# Patient Record
Sex: Male | Born: 1940 | Race: White | Hispanic: No | Marital: Married | Smoking: Never smoker
Health system: Southern US, Community
[De-identification: ages and names within clinical notes are randomized; demographics above are authoritative.]

## PROBLEM LIST (undated history)

## (undated) DIAGNOSIS — Z8669 Personal history of other diseases of the nervous system and sense organs: Secondary | ICD-10-CM

## (undated) DIAGNOSIS — I4510 Unspecified right bundle-branch block: Secondary | ICD-10-CM

## (undated) DIAGNOSIS — F1011 Alcohol abuse, in remission: Secondary | ICD-10-CM

## (undated) DIAGNOSIS — G4730 Sleep apnea, unspecified: Secondary | ICD-10-CM

## (undated) DIAGNOSIS — I10 Essential (primary) hypertension: Secondary | ICD-10-CM

## (undated) DIAGNOSIS — R202 Paresthesia of skin: Secondary | ICD-10-CM

## (undated) DIAGNOSIS — K219 Gastro-esophageal reflux disease without esophagitis: Secondary | ICD-10-CM

## (undated) DIAGNOSIS — M1990 Unspecified osteoarthritis, unspecified site: Secondary | ICD-10-CM

## (undated) DIAGNOSIS — E785 Hyperlipidemia, unspecified: Secondary | ICD-10-CM

## (undated) DIAGNOSIS — M4802 Spinal stenosis, cervical region: Secondary | ICD-10-CM

## (undated) DIAGNOSIS — R55 Syncope and collapse: Secondary | ICD-10-CM

## (undated) DIAGNOSIS — I480 Paroxysmal atrial fibrillation: Secondary | ICD-10-CM

## (undated) DIAGNOSIS — I2510 Atherosclerotic heart disease of native coronary artery without angina pectoris: Secondary | ICD-10-CM

## (undated) DIAGNOSIS — Z87898 Personal history of other specified conditions: Secondary | ICD-10-CM

## (undated) DIAGNOSIS — R188 Other ascites: Secondary | ICD-10-CM

## (undated) DIAGNOSIS — G459 Transient cerebral ischemic attack, unspecified: Secondary | ICD-10-CM

## (undated) DIAGNOSIS — M48062 Spinal stenosis, lumbar region with neurogenic claudication: Secondary | ICD-10-CM

## (undated) DIAGNOSIS — D329 Benign neoplasm of meninges, unspecified: Secondary | ICD-10-CM

## (undated) HISTORY — PX: HERNIA REPAIR: SHX51

## (undated) HISTORY — PX: EYE SURGERY: SHX253

## (undated) HISTORY — PX: TRIGGER FINGER RELEASE: SHX641

## (undated) HISTORY — PX: COLONOSCOPY: SHX174

## (undated) HISTORY — DX: Other ascites: R18.8

## (undated) HISTORY — PX: APPENDECTOMY: SHX54

## (undated) HISTORY — PX: UPPER GASTROINTESTINAL ENDOSCOPY: SHX188

---
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
## 1979-02-03 HISTORY — PX: OTHER SURGICAL HISTORY: SHX169

## 1997-02-02 DIAGNOSIS — R55 Syncope and collapse: Secondary | ICD-10-CM

## 1997-02-02 HISTORY — DX: Syncope and collapse: R55

## 2002-03-05 HISTORY — PX: CARDIAC CATHETERIZATION: SHX172

## 2002-09-06 HISTORY — PX: CERVICAL LAMINECTOMY: SHX94

## 2005-08-12 HISTORY — PX: ATRIAL ABLATION SURGERY: SHX560

## 2007-07-13 ENCOUNTER — Encounter: Admission: RE | Admit: 2007-07-13 | Discharge: 2007-07-13 | Payer: Self-pay | Admitting: Neurosurgery

## 2008-01-16 ENCOUNTER — Encounter: Admission: RE | Admit: 2008-01-16 | Discharge: 2008-01-16 | Payer: Self-pay | Admitting: Neurosurgery

## 2008-07-23 ENCOUNTER — Encounter: Admission: RE | Admit: 2008-07-23 | Discharge: 2008-07-23 | Payer: Self-pay | Admitting: Neurosurgery

## 2008-08-16 ENCOUNTER — Inpatient Hospital Stay (HOSPITAL_COMMUNITY): Admission: RE | Admit: 2008-08-16 | Discharge: 2008-08-18 | Payer: Self-pay | Admitting: Orthopedic Surgery

## 2008-09-24 ENCOUNTER — Ambulatory Visit (HOSPITAL_COMMUNITY): Admission: RE | Admit: 2008-09-24 | Discharge: 2008-09-24 | Payer: Self-pay | Admitting: Neurosurgery

## 2008-10-03 HISTORY — PX: BRAIN MENINGIOMA EXCISION: SHX576

## 2008-10-11 ENCOUNTER — Inpatient Hospital Stay (HOSPITAL_COMMUNITY): Admission: RE | Admit: 2008-10-11 | Discharge: 2008-10-13 | Payer: Self-pay | Admitting: Neurosurgery

## 2008-10-11 ENCOUNTER — Encounter (INDEPENDENT_AMBULATORY_CARE_PROVIDER_SITE_OTHER): Payer: Self-pay | Admitting: Neurosurgery

## 2008-10-14 ENCOUNTER — Emergency Department (HOSPITAL_COMMUNITY): Admission: EM | Admit: 2008-10-14 | Discharge: 2008-10-14 | Payer: Self-pay | Admitting: Emergency Medicine

## 2009-01-09 ENCOUNTER — Encounter: Admission: RE | Admit: 2009-01-09 | Discharge: 2009-01-09 | Payer: Self-pay | Admitting: Neurosurgery

## 2010-03-20 IMAGING — CR DG CHEST 1V PORT
1 series · 1 of 1 positions shown · non-contrast
Comparison: 08/13/2008.

CLINICAL DATA: Chest pain shortness of breath.  Hypertension.

PORTABLE CHEST - 1 VIEW

[AP]
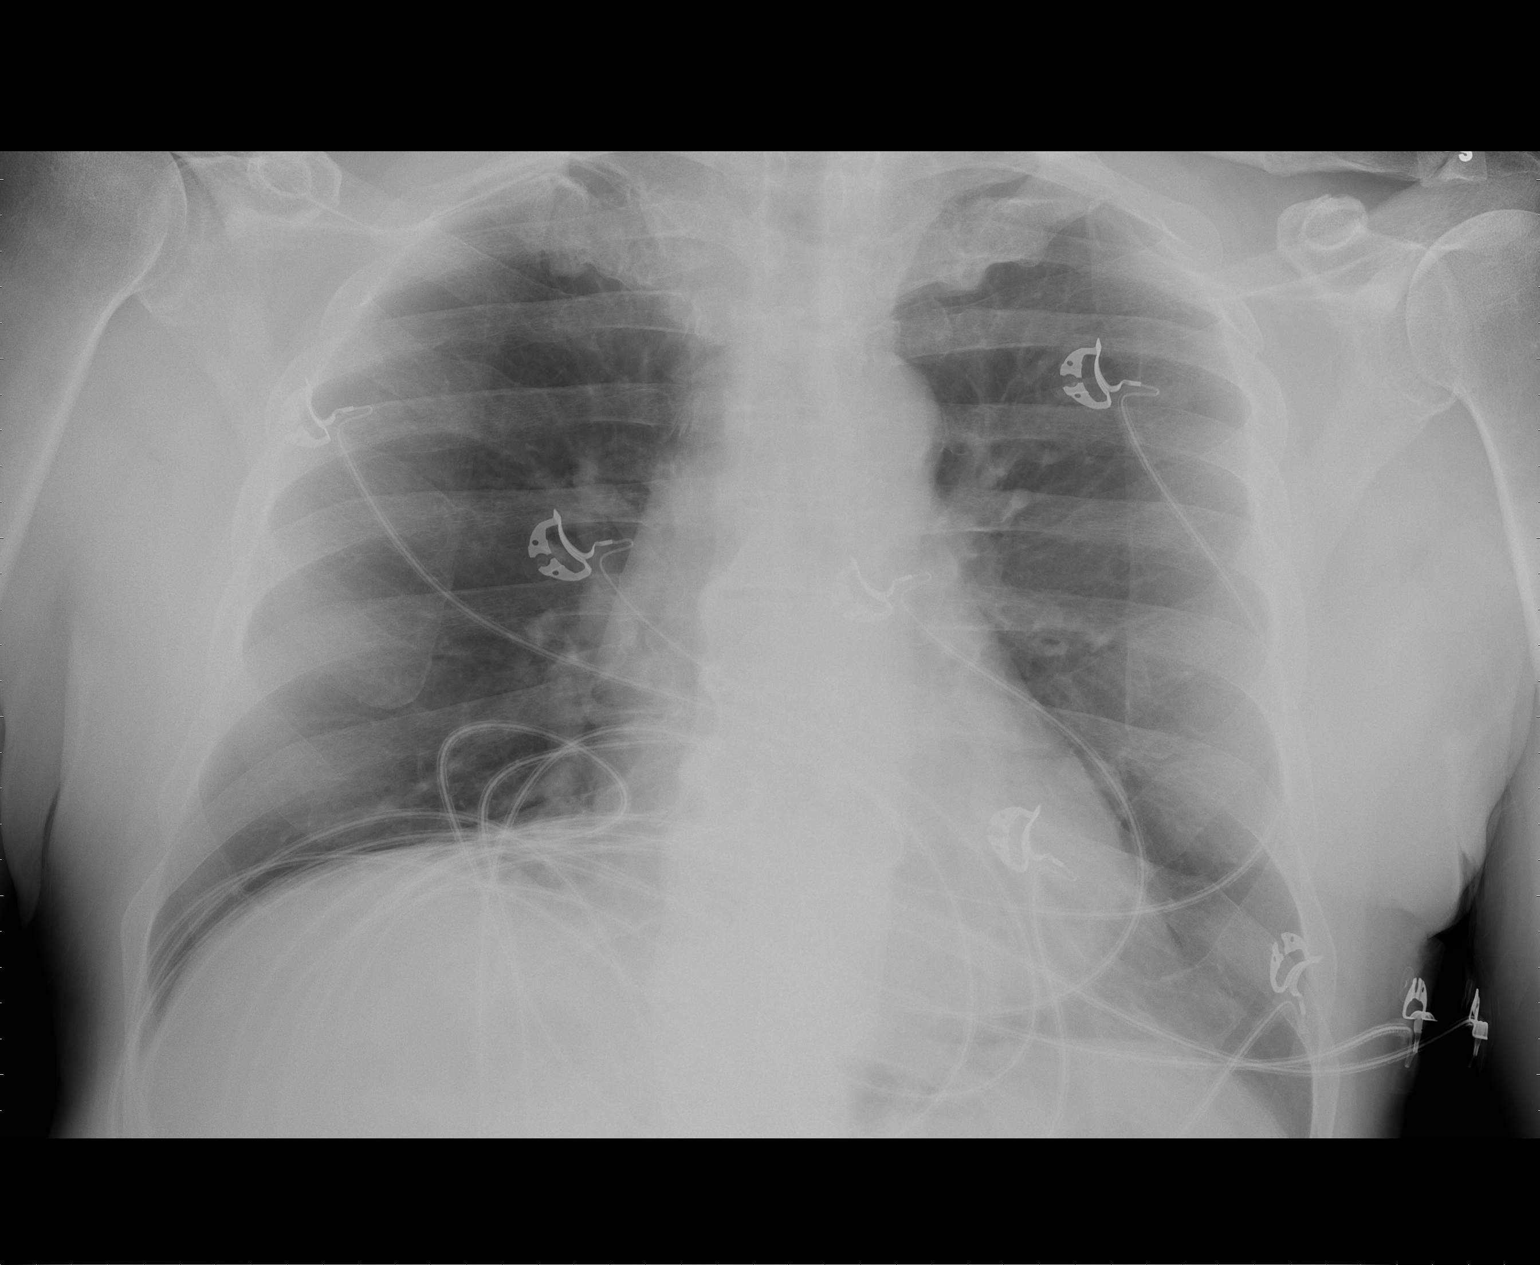

[1 of 1 positions shown; findings below may reference images not displayed]

FINDINGS: 0865 hours.  The heart size and mediastinal contours are
stable.  The lungs are mildly hyperinflated but clear.  There is no
pleural effusion.  Telemetry leads overlie the chest.  There has
been previous cervical fusion.
IMPRESSION: No active cardiopulmonary process.

## 2010-05-09 LAB — POCT CARDIAC MARKERS
CKMB, poc: <1 ng/mL — ABNORMAL LOW (ref 1.0–8.0)
Myoglobin, poc: 86.7 ng/mL (ref 12–200)

## 2010-05-09 LAB — URINALYSIS, ROUTINE W REFLEX MICROSCOPIC
Bilirubin Urine: NEGATIVE
Glucose, UA: NEGATIVE mg/dL
Hgb urine dipstick: NEGATIVE
Ketones, ur: NEGATIVE mg/dL
Protein, ur: NEGATIVE mg/dL
Urobilinogen, UA: 0.2 mg/dL (ref 0.0–1.0)

## 2010-05-09 LAB — CBC
HCT: 39 % (ref 39.0–52.0)
HCT: 39.4 % (ref 39.0–52.0)
Hemoglobin: 13.6 g/dL (ref 13.0–17.0)
MCV: 92.4 fL (ref 78.0–100.0)
MCV: 92.5 fL (ref 78.0–100.0)
Platelets: 322 10*3/uL (ref 150–400)
RBC: 4.22 MIL/uL (ref 4.22–5.81)
RBC: 4.26 MIL/uL (ref 4.22–5.81)
WBC: 11.5 10*3/uL — ABNORMAL HIGH (ref 4.0–10.5)
WBC: 8.1 10*3/uL (ref 4.0–10.5)

## 2010-05-09 LAB — TYPE AND SCREEN

## 2010-05-09 LAB — DIFFERENTIAL
Eosinophils Absolute: 0.1 10*3/uL (ref 0.0–0.7)
Lymphocytes Relative: 22 % (ref 12–46)
Lymphs Abs: 2.5 10*3/uL (ref 0.7–4.0)
Monocytes Relative: 10 % (ref 3–12)
Neutrophils Relative %: 68 % (ref 43–77)

## 2010-05-09 LAB — POCT I-STAT, CHEM 8
HCT: 41 % (ref 39.0–52.0)
Hemoglobin: 13.9 g/dL (ref 13.0–17.0)
Potassium: 3.7 mEq/L (ref 3.5–5.1)
Sodium: 132 mEq/L — ABNORMAL LOW (ref 135–145)
TCO2: 30 mmol/L (ref 0–100)

## 2010-05-09 LAB — PROTIME-INR
INR: 1 (ref 0.00–1.49)
Prothrombin Time: 13.3 seconds (ref 11.6–15.2)

## 2010-05-09 LAB — ABO/RH: ABO/RH(D): A POS

## 2010-05-09 LAB — BASIC METABOLIC PANEL
Chloride: 94 mEq/L — ABNORMAL LOW (ref 96–112)
GFR calc Af Amer: >60 mL/min (ref >60)
GFR calc non Af Amer: >60 mL/min (ref >60)
Potassium: 4.3 mEq/L (ref 3.5–5.1)
Sodium: 131 mEq/L — ABNORMAL LOW (ref 135–145)

## 2010-05-09 LAB — D-DIMER, QUANTITATIVE: D-Dimer, Quant: 1.02 ug/mL-FEU — ABNORMAL HIGH (ref 0.00–0.48)

## 2010-05-10 LAB — CREATININE, SERUM: GFR calc non Af Amer: >60 mL/min (ref >60)

## 2010-05-10 LAB — BUN: BUN: 5 mg/dL — ABNORMAL LOW (ref 6–23)

## 2010-05-11 LAB — CBC
HCT: 26.5 % — ABNORMAL LOW (ref 39.0–52.0)
Hemoglobin: 10.4 g/dL — ABNORMAL LOW (ref 13.0–17.0)
MCHC: 35.3 g/dL (ref 30.0–36.0)
MCV: 94.4 fL (ref 78.0–100.0)
MCV: 94.1 fL (ref 78.0–100.0)
Platelets: 203 10*3/uL (ref 150–400)
Platelets: 291 10*3/uL (ref 150–400)
RBC: 3.11 MIL/uL — ABNORMAL LOW (ref 4.22–5.81)
RDW: 13 % (ref 11.5–15.5)
RDW: 13.4 % (ref 11.5–15.5)
WBC: 11.4 10*3/uL — ABNORMAL HIGH (ref 4.0–10.5)
WBC: 6.1 10*3/uL (ref 4.0–10.5)

## 2010-05-11 LAB — BASIC METABOLIC PANEL
Chloride: 93 mEq/L — ABNORMAL LOW (ref 96–112)
Chloride: 95 mEq/L — ABNORMAL LOW (ref 96–112)
Creatinine, Ser: 1.02 mg/dL (ref 0.4–1.5)
GFR calc Af Amer: >60 mL/min (ref >60)
GFR calc non Af Amer: >60 mL/min (ref >60)
Glucose, Bld: 103 mg/dL — ABNORMAL HIGH (ref 70–99)
Potassium: 3.4 mEq/L — ABNORMAL LOW (ref 3.5–5.1)
Potassium: 4 mEq/L (ref 3.5–5.1)
Sodium: 130 mEq/L — ABNORMAL LOW (ref 135–145)
Sodium: 131 mEq/L — ABNORMAL LOW (ref 135–145)

## 2010-05-11 LAB — COMPREHENSIVE METABOLIC PANEL
AST: 50 U/L — ABNORMAL HIGH (ref 0–37)
Albumin: 4.2 g/dL (ref 3.5–5.2)
BUN: 10 mg/dL (ref 6–23)
Calcium: 10.1 mg/dL (ref 8.4–10.5)
Chloride: 93 mEq/L — ABNORMAL LOW (ref 96–112)
Creatinine, Ser: 0.97 mg/dL (ref 0.4–1.5)
GFR calc Af Amer: >60 mL/min (ref >60)
Total Bilirubin: 0.8 mg/dL (ref 0.3–1.2)
Total Protein: 7.7 g/dL (ref 6.0–8.3)

## 2010-05-11 LAB — PROTIME-INR
INR: 1.9 — ABNORMAL HIGH (ref 0.00–1.49)
INR: 1 (ref 0.00–1.49)
Prothrombin Time: 15.5 seconds — ABNORMAL HIGH (ref 11.6–15.2)

## 2010-05-11 LAB — URINALYSIS, ROUTINE W REFLEX MICROSCOPIC
Nitrite: NEGATIVE
Specific Gravity, Urine: 1.006 (ref 1.005–1.030)
Urobilinogen, UA: 0.2 mg/dL (ref 0.0–1.0)
pH: 7 (ref 5.0–8.0)

## 2010-05-11 LAB — APTT: aPTT: 29 seconds (ref 24–37)

## 2010-06-17 NOTE — Op Note (Signed)
NAME:  Nathan Haney, SAINVIL NO.:  [id number]   MEDICAL RECORD NO.:  [id number]          PATIENT TYPE:  INP   LOCATION:  5015                         FACILITY:  [facility]   PHYSICIAN:  Vania Rea. Supple, M.D.  DATE OF BIRTH:  07/24/1940   DATE OF PROCEDURE:  08/16/2008  DATE OF DISCHARGE:                               OPERATIVE REPORT   PREOPERATIVE DIAGNOSIS:  End-stage right knee osteoarthrosis.   POSTOPERATIVE DIAGNOSIS:  End-stage right knee osteoarthrosis.   PROCEDURE:  Cemented right total knee arthroplasty utilizing a size 5  femur and size 5 tibia, a 12.5-mm thick rotating platform polyethylene  insert and a 35-mm patellar button of the DePuy Sigma System.   SURGEON:  Vania Rea. Supple, MD   ASSISTANT:  Lucita Lora. Shuford, PA-C   ANESTHESIA:  General endotracheal as well as femoral nerve block.   TOURNIQUET TIME:  Probably 70 minutes.   ESTIMATED BLOOD LOSS:  150 mL.   DRAINS:  Hemovac x1.   HISTORY:  Nathan Haney is a 70 year old gentleman who has had progressive  increasing right knee pain and functional limitations secondary to end-  stage osteoarthrosis.  He has varus deformity of the knee with bone-on-  bone contact medially.  Due to his ongoing pain and functional  limitations, he is brought to the operating room at this time for plan  of right total knee arthroplasty.   Preoperatively, I counseled Nathan Haney on treatment options as well as  risks and benefits thereof.  Possible surgical complications including  infection, neurovascular injury, DVT, PE as well as persistent pain and  possible need for revision of surgery were all reviewed.  He understands  and accepts and agrees with our planned procedure.   PROCEDURE IN DETAIL:  After undergoing routine preop evaluation, the  patient received prophylactic antibiotics.  Femoral nerve block was  established in the holding area by the Anesthesia Department.  Placed  supine on the operative table and underwent  smooth induction of general  endotracheal anesthesia.  A Foley catheter was placed.  Tourniquet was  applied to the right thigh.  Right leg was sterilely prepped and draped  in standard fashion.  Time-out was called.  Leg was exsanguinated with  the tourniquet inflated to 350 mmHg.  An anterior midline incision was  then made approximately 20 cm in length centered over the patella and  the anterior aspect of the knee.  Skin flaps were elevated.  The medial  parapatellar arthrotomy was performed with electrocautery.  The patella  was everted.  Fat pad was excised.  Knee was flexed up and the drill was  then used to gain access to the femoral medullary canal.  We made an 11-  mm resection from the distal femur at a 5-degree valgus cut.  The distal  femur was sized with a size 5 showing the best coverage and fit.  The  size 5 cutting block was then applied and the anterior, posterior, and  chamfer cuts were then made.  Trial reduction showed good fit.  We then  turned our attention to  the proximal tibia.  We removed the remnants of  the menisci and cruciate ligaments and then with the extramedullary  guide, placed a cutting block on the proximal tibia maintaining proper  alignment and made a 10-mm resection measured from the lateral tibial  plateau.  Proximal tibia was then inspected.  Peripheral soft tissues  and osteophytes were removed.  The proximal tibia showed best fit with a  size 5, which was temporarily pinned in position.  We performed a trial  reduction and showed excellent soft tissue balance.  We did perform a  medial release to correct the varus deformity.  With appropriate  stability achieved, we then completed terminal preparation of the  proximal tibia using the reamer and broach.  We turned our attention to  the distal femur with the box cutting, but guide applied and box cut was  made.  We then confirmed that two appropriate cuts were then made with  proper fit to the  femoral component and this was confirmed.  We then  turned our attention to the patella where an oscillating saw was then  used to remove the approximately 8.5 mm of bone and the stabilizing  drill holes were then placed.  Final reduction showed good stability and  excellent motion.  All implants were then removed.  The joint was  copiously irrigated.  We meticulously cleaned the posterior compartment  of residual meniscal debris, bone spurs.  The joint was pulsalitely  lavaged.  We then mixed the cement at the appropriate consistency.  The  implants were all cemented into position.  Meticulous removal of all  extra cement was then completed.  We then performed a trial reductions  with 10 and 12.5.  The 12.5 showed excellent soft tissue balance.  The  final 12.5 tibial tray was then placed showing excellent stability and  good soft tissue balance in full knee extension.  At this point in time,  the tourniquet was let down.  Hemovac drains were brought out  superolaterally.  Hemostasis was obtained.  The knee incision was closed  with interrupted figure-of-eight #1 Vicryl sutures for the parapatellar  arthrotomy, 2-0 Vicryl of the subcu and intracuticular 3-0 Monocryl for  the skin followed by Steri-Strips.  Dry dressing was applied.  The  patient was then awakened, extubated, and taken to the recovery room in  stable condition.      Vania Rea. Supple, M.D.  Electronically Signed     KMS/MEDQ  D:  08/16/2008  T:  08/16/2008  Job:  [id number]

## 2016-05-28 HISTORY — PX: ATRIAL ABLATION SURGERY: SHX560

## 2017-02-17 ENCOUNTER — Other Ambulatory Visit (HOSPITAL_COMMUNITY): Payer: Self-pay | Admitting: Specialist

## 2017-02-17 DIAGNOSIS — R609 Edema, unspecified: Secondary | ICD-10-CM

## 2017-02-18 ENCOUNTER — Ambulatory Visit (HOSPITAL_COMMUNITY)
Admission: RE | Admit: 2017-02-18 | Discharge: 2017-02-18 | Disposition: A | Discharge status: Home / Self Care | Payer: Medicare Other | Source: Ambulatory Visit | Attending: Orthopedic Surgery | Admitting: Orthopedic Surgery

## 2017-02-18 DIAGNOSIS — M79605 Pain in left leg: Secondary | ICD-10-CM | POA: Insufficient documentation

## 2017-02-18 DIAGNOSIS — M79604 Pain in right leg: Secondary | ICD-10-CM | POA: Insufficient documentation

## 2017-02-18 DIAGNOSIS — M79662 Pain in left lower leg: Secondary | ICD-10-CM | POA: Diagnosis not present

## 2017-02-18 DIAGNOSIS — M79661 Pain in right lower leg: Secondary | ICD-10-CM | POA: Diagnosis not present

## 2017-02-18 DIAGNOSIS — R609 Edema, unspecified: Secondary | ICD-10-CM

## 2017-02-18 NOTE — Progress Notes (Signed)
Bilateral lower extremity venous duplex has been completed. Negative for DVT.  Results were given to April at Dr. Reather Littler office.  02/18/17 1:15 PM Nathan Haney RVT

## 2017-03-26 ENCOUNTER — Ambulatory Visit: Payer: Self-pay | Admitting: Orthopedic Surgery

## 2017-04-15 ENCOUNTER — Other Ambulatory Visit (HOSPITAL_COMMUNITY): Payer: Self-pay | Admitting: Emergency Medicine

## 2017-04-15 ENCOUNTER — Encounter (HOSPITAL_COMMUNITY): Payer: Self-pay

## 2017-04-15 NOTE — Progress Notes (Signed)
LOV with Nathan Diego Dolleschel, NP 03-04-17 on chart from [facility] at [facility] cardiology 02-08-17 Dr Omelia Blackwater at [facility] in epic care everywhere   EKG 03-04-17 on chart from [facility] at [facility]

## 2017-04-15 NOTE — Patient Instructions (Addendum)
Nathan Haney  04/15/2017   Your procedure is scheduled on: 04-28-17  Report to [facility] Main  Entrance   ARRIVE AT 36 AM. Have a seat in the Main Lobby. Please note there is a phone at the [employer]. Please call [phone] on that phone. Someone from Short Stay will come and get you from the Main Lobby and take you to Short Stay.    Call this number if you have problems the morning of surgery [phone]     BRING CPAP MASK AND TUBING ONLY. DEVICE WILL BE PROVIDED!   Remember: Do not eat food or drink liquids :After Midnight.     Take these medicines the morning of surgery with A SIP OF WATER: famotidine, allegra, eye drops if needed                                You may not have any metal on your body including hair pins and              piercings  Do not wear jewelry, make-up, lotions, powders or perfumes, deodorant                      Men may shave face and neck.   Do not bring valuables to the hospital. [place].  Contacts, dentures or bridgework may not be worn into surgery.  Leave suitcase in the car. After surgery it may be brought to your room.                Please read over the following fact sheets you were given: _____________________________________________________________________             [facility] - Preparing for Surgery Before surgery, you can play an important role.  Because skin is not sterile, your skin needs to be as free of germs as possible.  You can reduce the number of germs on your skin by washing with CHG (chlorahexidine gluconate) soap before surgery.  CHG is an antiseptic cleaner which kills germs and bonds with the skin to continue killing germs even after washing. Please DO NOT use if you have an allergy to CHG or antibacterial soaps.  If your skin becomes reddened/irritated stop using the CHG and inform your nurse when you arrive at Short  Stay. Do not shave (including legs and underarms) for at least 48 hours prior to the first CHG shower.  You may shave your face/neck. Please follow these instructions carefully:  1.  Shower with CHG Soap the night before surgery and the  morning of Surgery.  2.  If you choose to wash your hair, wash your hair first as usual with your  normal  shampoo.  3.  After you shampoo, rinse your hair and body thoroughly to remove the  shampoo.                           4.  Use CHG as you would any other liquid soap.  You can apply chg directly  to the skin and wash  Gently with a scrungie or clean washcloth.  5.  Apply the CHG Soap to your body ONLY FROM THE NECK DOWN.   Do not use on face/ open                           Wound or open sores. Avoid contact with eyes, ears mouth and genitals (private parts).                       Wash face,  Genitals (private parts) with your normal soap.             6.  Wash thoroughly, paying special attention to the area where your surgery  will be performed.  7.  Thoroughly rinse your body with warm water from the neck down.  8.  DO NOT shower/wash with your normal soap after using and rinsing off  the CHG Soap.                9.  Pat yourself dry with a clean towel.            10.  Wear clean pajamas.            11.  Place clean sheets on your bed the night of your first shower and do not  sleep with pets. Day of Surgery : Do not apply any lotions/deodorants the morning of surgery.  Please wear clean clothes to the hospital/surgery center.  FAILURE TO FOLLOW THESE INSTRUCTIONS MAY RESULT IN THE CANCELLATION OF YOUR SURGERY PATIENT SIGNATURE_________________________________  NURSE SIGNATURE__________________________________  ________________________________________________________________________   Nathan Haney  An incentive spirometer is a tool that can help keep your lungs clear and active. This tool measures how well you are  filling your lungs with each breath. Taking long deep breaths may help reverse or decrease the chance of developing breathing (pulmonary) problems (especially infection) following:  A long period of time when you are unable to move or be active. BEFORE THE PROCEDURE   If the spirometer includes an indicator to show your best effort, your nurse or respiratory therapist will set it to a desired goal.  If possible, sit up straight or lean slightly forward. Try not to slouch.  Hold the incentive spirometer in an upright position. INSTRUCTIONS FOR USE  1. Sit on the edge of your bed if possible, or sit up as far as you can in bed or on a chair. 2. Hold the incentive spirometer in an upright position. 3. Breathe out normally. 4. Place the mouthpiece in your mouth and seal your lips tightly around it. 5. Breathe in slowly and as deeply as possible, raising the piston or the ball toward the top of the column. 6. Hold your breath for 3-5 seconds or for as long as possible. Allow the piston or ball to fall to the bottom of the column. 7. Remove the mouthpiece from your mouth and breathe out normally. 8. Rest for a few seconds and repeat Steps 1 through 7 at least 10 times every 1-2 hours when you are awake. Take your time and take a few normal breaths between deep breaths. 9. The spirometer may include an indicator to show your best effort. Use the indicator as a goal to work toward during each repetition. 10. After each set of 10 deep breaths, practice coughing to be sure your lungs are clear. If you have an incision (the cut made at the time of surgery),  support your incision when coughing by placing a pillow or rolled up towels firmly against it. Once you are able to get out of bed, walk around indoors and cough well. You may stop using the incentive spirometer when instructed by your caregiver.  RISKS AND COMPLICATIONS  Take your time so you do not get dizzy or light-headed.  If you are in pain,  you may need to take or ask for pain medication before doing incentive spirometry. It is harder to take a deep breath if you are having pain. AFTER USE  Rest and breathe slowly and easily.  It can be helpful to keep track of a log of your progress. Your caregiver can provide you with a simple table to help with this. If you are using the spirometer at home, follow these instructions: [place] IF:   You are having difficultly using the spirometer.  You have trouble using the spirometer as often as instructed.  Your pain medication is not giving enough relief while using the spirometer.  You develop fever of 100.5 F (38.1 C) or higher. SEEK IMMEDIATE MEDICAL CARE IF:   You cough up bloody sputum that had not been present before.  You develop fever of 102 F (38.9 C) or greater.  You develop worsening pain at or near the incision site. MAKE SURE YOU:   Understand these instructions.  Will watch your condition.  Will get help right away if you are not doing well or get worse. Document Released: 06/01/2006 Document Revised: 04/13/2011 Document Reviewed: 08/02/2006 [facility] Patient Information 2014 [place], [place].   ________________________________________________________________________

## 2017-04-19 ENCOUNTER — Encounter (HOSPITAL_COMMUNITY)
Admission: RE | Admit: 2017-04-19 | Discharge: 2017-04-19 | Disposition: A | Discharge status: Home / Self Care | Payer: Medicare Other | Source: Ambulatory Visit | Attending: Specialist | Admitting: Specialist

## 2017-04-19 ENCOUNTER — Other Ambulatory Visit: Payer: Self-pay

## 2017-04-19 ENCOUNTER — Encounter (HOSPITAL_COMMUNITY): Payer: Self-pay

## 2017-04-19 DIAGNOSIS — Z01812 Encounter for preprocedural laboratory examination: Secondary | ICD-10-CM | POA: Insufficient documentation

## 2017-04-19 DIAGNOSIS — M1712 Unilateral primary osteoarthritis, left knee: Secondary | ICD-10-CM | POA: Insufficient documentation

## 2017-04-19 HISTORY — DX: Transient cerebral ischemic attack, unspecified: G45.9

## 2017-04-19 HISTORY — DX: Gastro-esophageal reflux disease without esophagitis: K21.9

## 2017-04-19 HISTORY — DX: Paresthesia of skin: R20.2

## 2017-04-19 HISTORY — DX: Personal history of other diseases of the nervous system and sense organs: Z86.69

## 2017-04-19 HISTORY — DX: Sleep apnea, unspecified: G47.30

## 2017-04-19 HISTORY — DX: Benign neoplasm of meninges, unspecified: D32.9

## 2017-04-19 HISTORY — DX: Spinal stenosis, cervical region: M48.02

## 2017-04-19 HISTORY — DX: Atherosclerotic heart disease of native coronary artery without angina pectoris: I25.10

## 2017-04-19 HISTORY — DX: Personal history of other specified conditions: Z87.898

## 2017-04-19 HISTORY — DX: Syncope and collapse: R55

## 2017-04-19 HISTORY — DX: Unspecified osteoarthritis, unspecified site: M19.90

## 2017-04-19 HISTORY — DX: Essential (primary) hypertension: I10

## 2017-04-19 HISTORY — DX: Spinal stenosis, lumbar region with neurogenic claudication: M48.062

## 2017-04-19 HISTORY — DX: Hyperlipidemia, unspecified: E78.5

## 2017-04-19 HISTORY — DX: Paroxysmal atrial fibrillation: I48.0

## 2017-04-19 HISTORY — DX: Unspecified right bundle-branch block: I45.10

## 2017-04-19 HISTORY — DX: Alcohol abuse, in remission: F10.11

## 2017-04-19 LAB — URINALYSIS, ROUTINE W REFLEX MICROSCOPIC
Bacteria, UA: NONE SEEN
Bilirubin Urine: NEGATIVE
GLUCOSE, UA: NEGATIVE mg/dL
Ketones, ur: NEGATIVE mg/dL
Leukocytes, UA: NEGATIVE
Nitrite: NEGATIVE
Protein, ur: NEGATIVE mg/dL
SPECIFIC GRAVITY, URINE: 1.008 (ref 1.005–1.030)
Squamous Epithelial / LPF: NONE SEEN
pH: 6 (ref 5.0–8.0)

## 2017-04-19 LAB — SURGICAL PCR SCREEN
MRSA, PCR: NEGATIVE
STAPHYLOCOCCUS AUREUS: NEGATIVE

## 2017-04-19 LAB — BASIC METABOLIC PANEL
Anion gap: 10 (ref 5–15)
BUN: 10 mg/dL (ref 6–20)
CALCIUM: 9.8 mg/dL (ref 8.9–10.3)
CO2: 28 mmol/L (ref 22–32)
CREATININE: 0.98 mg/dL (ref 0.61–1.24)
Chloride: 103 mmol/L (ref 101–111)
GFR calc Af Amer: >60 mL/min (ref >60)
Glucose, Bld: 96 mg/dL (ref 65–99)
POTASSIUM: 4.3 mmol/L (ref 3.5–5.1)
SODIUM: 141 mmol/L (ref 135–145)

## 2017-04-19 LAB — CBC
HCT: 43.7 % (ref 39.0–52.0)
Hemoglobin: 14.5 g/dL (ref 13.0–17.0)
MCH: 31.5 pg (ref 26.0–34.0)
MCHC: 33.2 g/dL (ref 30.0–36.0)
MCV: 94.8 fL (ref 78.0–100.0)
PLATELETS: 243 10*3/uL (ref 150–400)
RBC: 4.61 MIL/uL (ref 4.22–5.81)
RDW: 13.8 % (ref 11.5–15.5)
WBC: 7.6 10*3/uL (ref 4.0–10.5)

## 2017-04-19 LAB — PROTIME-INR
INR: 1.74
PROTHROMBIN TIME: 20.1 s — AB (ref 11.4–15.2)

## 2017-04-19 LAB — APTT: APTT: 40 s — AB (ref 24–36)

## 2017-04-19 NOTE — Progress Notes (Signed)
RN called [facility] and spoke with [facility] on nursing line. RN informed Mariam that per patient PCP, patient needed cardiac clearance before proceeding with upcoming surgery. Mariam to route the request to patient regular cardiologist. RN will continue to F/U

## 2017-04-21 ENCOUNTER — Ambulatory Visit: Payer: Self-pay | Admitting: Orthopedic Surgery

## 2017-04-21 NOTE — H&P (Signed)
Nathan Haney is an 76 y.o. male.   Chief Complaint: L knee pain HPI: The patient is here today for his history and physical. He is scheduled for a left total knee arthroplasty by Dr. Tonita Cong. The surgery is scheduled for April 28, 2017 at [place] reports chronic pain in the left knee progressively worsening with time and refractory to conservative treatment including bracing, quad strengthening, medications, activity modifications, relative rest, injection therapy. The pain is interfering with quality-of-life and activities of daily living at this point and he would like to proceed with surgery.  Dr. Tonita Cong and the patient mutually agreed to proceed with a Left total knee replacement. Risks and benefits of the procedure were discussed including stiffness, suboptimal range of motion, persistent pain, infection requiring removal of prosthesis and reinsertion, need for prophylactic antibiotics in the future, for example, dental procedures, possible need for manipulation, revision in the future and also anesthetic complications including DVT, PE, etc. We discussed the perioperative course, time in the hospital, postoperative recovery and the need for elevation to control swelling. We also discussed the predicted range of motion and the probability that squatting and kneeling would be unobtainable in the future. In addition, postoperative anticoagulation was discussed. We have obtained preoperative medical clearance as necessary. Provided illustrated handout and discussed it in detail. They will enroll in the total joint replacement educational forum at the hospital.  He has been instructed to hold his Xarelto for 3 days preoperatively. He underwent the right knee in 9563 with no complications, had Percocet postoperatively which worked well for him. He is scheduled for postoperative outpatient PT at [facility] on April 1 already. ROS  Past Medical History:  Diagnosis Date  . Arthritis     left knee  . Coronary atherosclerosis of native coronary artery   . GERD (gastroesophageal reflux disease)   . H/O brain tumor   . H/O ETOH abuse   . HLD (hyperlipidemia)   . Hx of migraines   . Hypertension   . Incomplete right bundle branch block    new as of 02-2017 per LOV with [name], NP on chart   . Lumbar stenosis with neurogenic claudication    hx of   . Meningioma ([place])   . Neurologic cardiac syncope 1999   positive tilt test ; treated; [place], [place]; at pre-op appt on 04-19-17 patient reports last episode was 04-09-2017 , was siting watching tv  and stood up and took 8 steps into the kitchen and felt dizzy , grabbed a chair and colapsed. deneis presyncope sx , but states " i can feel my muscles just stop working ". denies hitting head with fall , only sustained bruise to right arm from fall.   . Paresthesia of left lower extremity   . Paroxysmal A-fib [facility])    s/p ablation   . Sleep apnea    on cpap  . Stenosis, cervical spine    h/o   . TIA (transient ischemic attack)    see ED encounter 12-11-16 in epic care everywhere ' per patient on 04-19-17 " they never were sure i had one , i just had symptoms" report CT of his head was fine     Past Surgical History:  Procedure Laterality Date  . APPENDECTOMY  age 29   . ATRIAL ABLATION SURGERY Left 05/28/2016   Dr Heather Roberts at [place]   . ATRIAL ABLATION SURGERY  08/12/2005   Dr Omelia Blackwater   . BRAIN MENINGIOMA EXCISION  10/2008  . CARDIAC CATHETERIZATION  03/2002  . CERVICAL LAMINECTOMY  09/06/2002   c3-c7  . EYE SURGERY Left 1961   " i was diving for a foul ball and ran into a rail", sustained crush injury to orbital plate left eye ; has  metal in place   . EYE SURGERY     cataract bilateral   . HERNIA REPAIR    . lipoma excision   1981   states surgery was intended for a hernia repair but they found a lipoma instea dand removed it   . TRIGGER FINGER RELEASE     pinky finger right hand     No family history on  file. Social History:  reports that  has never smoked. he has never used smokeless tobacco. He reports that he drinks about 8.4 oz of alcohol per week. He reports that he does not use drugs.  Allergies:  Allergies  Allergen Reactions  . Eggs Or Egg-Derived Products Swelling and Other (See Comments)    egg whites only!  Causes GI discomfort and excess phlegm   . Carvedilol Diarrhea  . Cheese     Cheddar cheese; Upset stomach   . Hydralazine Other (See Comments)    Abdominal pain     (Not in a hospital admission)  No results found for this or any previous visit (from the past 48 hour(s)). No results found.  Review of Systems  Constitutional: Negative.   HENT: Negative.   Eyes: Negative.   Respiratory: Negative.   Cardiovascular: Negative.   Gastrointestinal: Negative.   Genitourinary: Negative.   Musculoskeletal: Positive for joint pain.  Skin: Negative.   Neurological: Negative.   Psychiatric/Behavioral: Negative.     There were no vitals taken for this visit. Physical Exam  Constitutional: He is oriented to person, place, and time. He appears well-developed.  HENT:  Head: Normocephalic.  Eyes: Pupils are equal, round, and reactive to light.  Neck: Normal range of motion.  Cardiovascular: Normal rate.  Respiratory: Effort normal.  GI: Soft.  Musculoskeletal:  L knee Tender medial joint line patellofemoral pain to compression. Antalgic gait.   Neurological: He is alert and oriented to person, place, and time.    X-rays AP lateral demonstrates bone-on-bone arthrosis medial compartment with degenerative change of the patellofemoral joint.  Assessment/Plan Pt with end-stage L knee DJD, bone-on-bone, refractory to conservative tx, scheduled for left total knee replacement by Dr. Tonita Cong on 04/28/17. We again discussed the procedure itself as well as risks, complications and alternatives, including but not limited to DVT, PE, infx, bleeding, failure of procedure, need for  secondary procedure including manipulation, nerve injury, ongoing pain/symptoms, anesthesia risk, even stroke or death. Also discussed typical post-op protocols, activity restrictions, need for PT, flexion/extension exercises, time out of work. Discussed need for DVT ppx post-op per protocol. Discussed dental ppx and infx prevention. Also discussed limitations post-operatively such as kneeling and squatting. All questions were answered. Patient desires to proceed with surgery as scheduled.  Will hold supplements, ASA and NSAIDs accordingly, has been directed to hold Xarelto for 3 days pre-op. Will remain NPO after midnight the night before surgery. Has presented to [facility] for pre-op testing. Anticipate hospital stay to include at least 2 midnights given medical history and to ensure proper pain control. Plan to resume Xarelto for DVT ppx post-op. Plan Percocet, Colace, Miralax. Plan to D/C home and to start outpt PT immediately post-op as scheduled, with family members at home for assistance. Will follow up 10-14 days  post-op for suture removal and xrays.  Plan left total knee replacement  Cecilie Kicks., PA-C for Dr. Tonita Cong 04/21/2017, 2:08 PM

## 2017-04-21 NOTE — H&P (View-Only) (Signed)
Nathan Haney is an 77 y.o. male.   Chief Complaint: L knee pain HPI: The patient is here today for his history and physical. He is scheduled for a left total knee arthroplasty by Dr. Tonita Cong. The surgery is scheduled for April 28, 2017 at [place] reports chronic pain in the left knee progressively worsening with time and refractory to conservative treatment including bracing, quad strengthening, medications, activity modifications, relative rest, injection therapy. The pain is interfering with quality-of-life and activities of daily living at this point and he would like to proceed with surgery.  Dr. Tonita Cong and the patient mutually agreed to proceed with a Left total knee replacement. Risks and benefits of the procedure were discussed including stiffness, suboptimal range of motion, persistent pain, infection requiring removal of prosthesis and reinsertion, need for prophylactic antibiotics in the future, for example, dental procedures, possible need for manipulation, revision in the future and also anesthetic complications including DVT, PE, etc. We discussed the perioperative course, time in the hospital, postoperative recovery and the need for elevation to control swelling. We also discussed the predicted range of motion and the probability that squatting and kneeling would be unobtainable in the future. In addition, postoperative anticoagulation was discussed. We have obtained preoperative medical clearance as necessary. Provided illustrated handout and discussed it in detail. They will enroll in the total joint replacement educational forum at the hospital.  He has been instructed to hold his Xarelto for 3 days preoperatively. He underwent the right knee in 3235 with no complications, had Percocet postoperatively which worked well for him. He is scheduled for postoperative outpatient PT at [facility] on April 1 already. ROS  Past Medical History:  Diagnosis Date  . Arthritis     left knee  . Coronary atherosclerosis of native coronary artery   . GERD (gastroesophageal reflux disease)   . H/O brain tumor   . H/O ETOH abuse   . HLD (hyperlipidemia)   . Hx of migraines   . Hypertension   . Incomplete right bundle branch block    new as of 02-2017 per LOV with Nathan Diego Dolleschel, NP on chart   . Lumbar stenosis with neurogenic claudication    hx of   . Meningioma ([facility])   . Neurologic cardiac syncope 1999   positive tilt test ; treated; [place], [place]; at pre-op appt on 04-19-17 patient reports last episode was 04-09-2017 , was siting watching tv  and stood up and took 8 steps into the kitchen and felt dizzy , grabbed a chair and colapsed. deneis presyncope sx , but states " i can feel my muscles just stop working ". denies hitting head with fall , only sustained bruise to right arm from fall.   . Paresthesia of left lower extremity   . Paroxysmal A-fib [facility])    s/p ablation   . Sleep apnea    on cpap  . Stenosis, cervical spine    h/o   . TIA (transient ischemic attack)    see ED encounter 12-11-16 in epic care everywhere ' per patient on 04-19-17 " they never were sure i had one , i just had symptoms" report CT of his head was fine     Past Surgical History:  Procedure Laterality Date  . APPENDECTOMY  age 80   . ATRIAL ABLATION SURGERY Left 05/28/2016   Dr Heather Roberts at [place]   . ATRIAL ABLATION SURGERY  08/12/2005   Dr Omelia Blackwater   . BRAIN MENINGIOMA EXCISION  10/2008  . CARDIAC CATHETERIZATION  03/2002  . CERVICAL LAMINECTOMY  09/06/2002   c3-c7  . EYE SURGERY Left 1961   " i was diving for a foul ball and ran into a rail", sustained crush injury to orbital plate left eye ; has  metal in place   . EYE SURGERY     cataract bilateral   . HERNIA REPAIR    . lipoma excision   1981   states surgery was intended for a hernia repair but they found a lipoma instea dand removed it   . TRIGGER FINGER RELEASE     pinky finger right hand     No family history on  file. Social History:  reports that  has never smoked. he has never used smokeless tobacco. He reports that he drinks about 8.4 oz of alcohol per week. He reports that he does not use drugs.  Allergies:  Allergies  Allergen Reactions  . Eggs Or Egg-Derived Products Swelling and Other (See Comments)    egg whites only!  Causes GI discomfort and excess phlegm   . Carvedilol Diarrhea  . Cheese     Cheddar cheese; Upset stomach   . Hydralazine Other (See Comments)    Abdominal pain     (Not in a hospital admission)  No results found for this or any previous visit (from the past 48 hour(s)). No results found.  Review of Systems  Constitutional: Negative.   HENT: Negative.   Eyes: Negative.   Respiratory: Negative.   Cardiovascular: Negative.   Gastrointestinal: Negative.   Genitourinary: Negative.   Musculoskeletal: Positive for joint pain.  Skin: Negative.   Neurological: Negative.   Psychiatric/Behavioral: Negative.     There were no vitals taken for this visit. Physical Exam  Constitutional: He is oriented to person, place, and time. He appears well-developed.  HENT:  Head: Normocephalic.  Eyes: Pupils are equal, round, and reactive to light.  Neck: Normal range of motion.  Cardiovascular: Normal rate.  Respiratory: Effort normal.  GI: Soft.  Musculoskeletal:  L knee Tender medial joint line patellofemoral pain to compression. Antalgic gait.   Neurological: He is alert and oriented to person, place, and time.    X-rays AP lateral demonstrates bone-on-bone arthrosis medial compartment with degenerative change of the patellofemoral joint.  Assessment/Plan Pt with end-stage L knee DJD, bone-on-bone, refractory to conservative tx, scheduled for left total knee replacement by Dr. Tonita Cong on 04/28/17. We again discussed the procedure itself as well as risks, complications and alternatives, including but not limited to DVT, PE, infx, bleeding, failure of procedure, need for  secondary procedure including manipulation, nerve injury, ongoing pain/symptoms, anesthesia risk, even stroke or death. Also discussed typical post-op protocols, activity restrictions, need for PT, flexion/extension exercises, time out of work. Discussed need for DVT ppx post-op per protocol. Discussed dental ppx and infx prevention. Also discussed limitations post-operatively such as kneeling and squatting. All questions were answered. Patient desires to proceed with surgery as scheduled.  Will hold supplements, ASA and NSAIDs accordingly, has been directed to hold Xarelto for 3 days pre-op. Will remain NPO after midnight the night before surgery. Has presented to [facility] for pre-op testing. Anticipate hospital stay to include at least 2 midnights given medical history and to ensure proper pain control. Plan to resume Xarelto for DVT ppx post-op. Plan Percocet, Colace, Miralax. Plan to D/C home and to start outpt PT immediately post-op as scheduled, with family members at home for assistance. Will follow up 10-14 days  post-op for suture removal and xrays.  Plan left total knee replacement  Cecilie Kicks., PA-C for Dr. Tonita Cong 04/21/2017, 2:08 PM

## 2017-04-26 NOTE — Progress Notes (Signed)
RN received from [facility]  what appears to be clearance from PCP and no xarelto instructions. RN called and spoke to [place], RN at Dr Omelia Blackwater office and requested that cardiac clearance be sent ASAP which would include instructions on stopping xarelto .RN provided contact number and fax number.  [place] to send out request .

## 2017-04-27 MED ORDER — TRANEXAMIC ACID 1000 MG/10ML IV SOLN
1000.0000 mg | INTRAVENOUS | Status: AC
  Administered 2017-04-28: 1000 mg via INTRAVENOUS
  Filled 2017-04-27: qty 1100

## 2017-04-27 NOTE — Progress Notes (Signed)
04-27-17 Follow up call to [facility] for Cardiac clearance. Left call back message. Also contacted [facility] to see if cardiac clearance has been sent to them. Awaiting return call.

## 2017-04-27 NOTE — Progress Notes (Signed)
Discussed with Dr. Janeice Robinson, Anesthesiologist the cardiac note in Care Everywhere from [facility], along with anticoagulation instructions. Per Dr. Janeice Robinson, this is considered cardiac clearance. Okay for patient to proceed with surgery.

## 2017-04-28 ENCOUNTER — Inpatient Hospital Stay (HOSPITAL_COMMUNITY): Payer: Medicare Other

## 2017-04-28 ENCOUNTER — Inpatient Hospital Stay (HOSPITAL_COMMUNITY): Payer: Medicare Other | Admitting: Anesthesiology

## 2017-04-28 ENCOUNTER — Encounter (HOSPITAL_COMMUNITY): Payer: Self-pay | Admitting: Anesthesiology

## 2017-04-28 ENCOUNTER — Other Ambulatory Visit: Payer: Self-pay

## 2017-04-28 ENCOUNTER — Inpatient Hospital Stay (HOSPITAL_COMMUNITY)
Admission: RE | Admit: 2017-04-28 | Discharge: 2017-04-30 | DRG: 470 | Disposition: A | Discharge status: Home / Self Care | Payer: Medicare Other | Attending: Specialist | Admitting: Specialist

## 2017-04-28 ENCOUNTER — Encounter (HOSPITAL_COMMUNITY)
Admission: RE | Disposition: A | Discharge status: Home / Self Care | Payer: Self-pay | Source: Home / Self Care | Attending: Specialist

## 2017-04-28 DIAGNOSIS — G8929 Other chronic pain: Secondary | ICD-10-CM | POA: Diagnosis present

## 2017-04-28 DIAGNOSIS — I4510 Unspecified right bundle-branch block: Secondary | ICD-10-CM | POA: Diagnosis present

## 2017-04-28 DIAGNOSIS — I480 Paroxysmal atrial fibrillation: Secondary | ICD-10-CM | POA: Diagnosis present

## 2017-04-28 DIAGNOSIS — I2510 Atherosclerotic heart disease of native coronary artery without angina pectoris: Secondary | ICD-10-CM | POA: Diagnosis present

## 2017-04-28 DIAGNOSIS — E785 Hyperlipidemia, unspecified: Secondary | ICD-10-CM | POA: Diagnosis present

## 2017-04-28 DIAGNOSIS — G4730 Sleep apnea, unspecified: Secondary | ICD-10-CM | POA: Diagnosis present

## 2017-04-28 DIAGNOSIS — Z91012 Allergy to eggs: Secondary | ICD-10-CM

## 2017-04-28 DIAGNOSIS — Z86011 Personal history of benign neoplasm of the brain: Secondary | ICD-10-CM | POA: Diagnosis not present

## 2017-04-28 DIAGNOSIS — Z96659 Presence of unspecified artificial knee joint: Secondary | ICD-10-CM

## 2017-04-28 DIAGNOSIS — I10 Essential (primary) hypertension: Secondary | ICD-10-CM | POA: Diagnosis present

## 2017-04-28 DIAGNOSIS — Z888 Allergy status to other drugs, medicaments and biological substances status: Secondary | ICD-10-CM

## 2017-04-28 DIAGNOSIS — M25762 Osteophyte, left knee: Secondary | ICD-10-CM | POA: Diagnosis present

## 2017-04-28 DIAGNOSIS — Z91018 Allergy to other foods: Secondary | ICD-10-CM | POA: Diagnosis not present

## 2017-04-28 DIAGNOSIS — Z8673 Personal history of transient ischemic attack (TIA), and cerebral infarction without residual deficits: Secondary | ICD-10-CM | POA: Diagnosis not present

## 2017-04-28 DIAGNOSIS — K219 Gastro-esophageal reflux disease without esophagitis: Secondary | ICD-10-CM | POA: Diagnosis present

## 2017-04-28 DIAGNOSIS — M1712 Unilateral primary osteoarthritis, left knee: Principal | ICD-10-CM | POA: Diagnosis present

## 2017-04-28 DIAGNOSIS — M25562 Pain in left knee: Secondary | ICD-10-CM | POA: Diagnosis present

## 2017-04-28 HISTORY — PX: TOTAL KNEE ARTHROPLASTY: SHX125

## 2017-04-28 LAB — PROTIME-INR
INR: 0.98
PROTHROMBIN TIME: 12.9 s (ref 11.4–15.2)

## 2017-04-28 LAB — APTT: APTT: 30 s (ref 24–36)

## 2017-04-28 SURGERY — ARTHROPLASTY, KNEE, TOTAL
Anesthesia: General | Site: Knee | Laterality: Left

## 2017-04-28 MED ORDER — CEFAZOLIN SODIUM-DEXTROSE 2-4 GM/100ML-% IV SOLN
2.0000 g | INTRAVENOUS | Status: AC
  Administered 2017-04-28: 2 g via INTRAVENOUS
  Filled 2017-04-28: qty 100

## 2017-04-28 MED ORDER — METHOCARBAMOL 500 MG PO TABS
500.0000 mg | ORAL_TABLET | Freq: Four times a day (QID) | ORAL | Status: DC | PRN
  Administered 2017-04-29 (×2): 500 mg via ORAL
  Filled 2017-04-28 (×2): qty 1

## 2017-04-28 MED ORDER — METOPROLOL SUCCINATE ER 100 MG PO TB24
100.0000 mg | ORAL_TABLET | Freq: Every day | ORAL | Status: DC
  Administered 2017-04-28 – 2017-04-29 (×2): 100 mg via ORAL
  Filled 2017-04-28 (×2): qty 1

## 2017-04-28 MED ORDER — OXYCODONE HCL 5 MG PO TABS
10.0000 mg | ORAL_TABLET | ORAL | Status: DC | PRN
  Administered 2017-04-28: 15 mg via ORAL
  Administered 2017-04-29: 10 mg via ORAL
  Filled 2017-04-28: qty 3

## 2017-04-28 MED ORDER — METOCLOPRAMIDE HCL 5 MG/ML IJ SOLN
5.0000 mg | Freq: Three times a day (TID) | INTRAMUSCULAR | Status: DC | PRN
  Administered 2017-04-28: 14:00:00 10 mg via INTRAVENOUS
  Filled 2017-04-28: qty 2

## 2017-04-28 MED ORDER — STERILE WATER FOR IRRIGATION IR SOLN
Status: DC | PRN
  Administered 2017-04-28: 2000 mL

## 2017-04-28 MED ORDER — METOCLOPRAMIDE HCL 5 MG PO TABS: 5.0000 mg | ORAL_TABLET | Freq: Three times a day (TID) | ORAL | Status: DC | PRN

## 2017-04-28 MED ORDER — PROMETHAZINE HCL 25 MG/ML IJ SOLN: 6.2500 mg | INTRAMUSCULAR | Status: DC | PRN

## 2017-04-28 MED ORDER — ACETAMINOPHEN 325 MG PO TABS: 325.0000 mg | ORAL_TABLET | Freq: Four times a day (QID) | ORAL | Status: DC | PRN

## 2017-04-28 MED ORDER — LACTATED RINGERS IV SOLN: INTRAVENOUS | Status: DC

## 2017-04-28 MED ORDER — LIDOCAINE 2% (20 MG/ML) 5 ML SYRINGE
INTRAMUSCULAR | Status: DC | PRN
  Administered 2017-04-28: 100 mg via INTRAVENOUS

## 2017-04-28 MED ORDER — MIDAZOLAM HCL 2 MG/2ML IJ SOLN
INTRAMUSCULAR | Status: AC
  Filled 2017-04-28: qty 2

## 2017-04-28 MED ORDER — CALCIUM POLYCARBOPHIL 625 MG PO TABS
625.0000 mg | ORAL_TABLET | Freq: Two times a day (BID) | ORAL | Status: DC
  Administered 2017-04-28 – 2017-04-30 (×4): 625 mg via ORAL
  Filled 2017-04-28 (×4): qty 1

## 2017-04-28 MED ORDER — HYDROMORPHONE HCL 1 MG/ML IJ SOLN
INTRAMUSCULAR | Status: AC
  Administered 2017-04-28: 1 mg via INTRAVENOUS
  Filled 2017-04-28: qty 1

## 2017-04-28 MED ORDER — PROPOFOL 10 MG/ML IV BOLUS
INTRAVENOUS | Status: AC
  Filled 2017-04-28: qty 20

## 2017-04-28 MED ORDER — PHENOL 1.4 % MT LIQD: 1.0000 | OROMUCOSAL | Status: DC | PRN

## 2017-04-28 MED ORDER — ONDANSETRON HCL 4 MG PO TABS: 4.0000 mg | ORAL_TABLET | Freq: Four times a day (QID) | ORAL | Status: DC | PRN

## 2017-04-28 MED ORDER — SUFENTANIL CITRATE 50 MCG/ML IV SOLN
INTRAVENOUS | Status: DC | PRN
  Administered 2017-04-28 (×4): 10 ug via INTRAVENOUS
  Administered 2017-04-28: 20 ug via INTRAVENOUS
  Administered 2017-04-28: 10 ug via INTRAVENOUS
  Administered 2017-04-28: 5 ug via INTRAVENOUS

## 2017-04-28 MED ORDER — MEPERIDINE HCL 50 MG/ML IJ SOLN: 6.2500 mg | INTRAMUSCULAR | Status: DC | PRN

## 2017-04-28 MED ORDER — SODIUM CHLORIDE 0.9 % IJ SOLN
INTRAMUSCULAR | Status: AC
  Filled 2017-04-28: qty 10

## 2017-04-28 MED ORDER — ROPIVACAINE HCL 7.5 MG/ML IJ SOLN
INTRAMUSCULAR | Status: DC | PRN
  Administered 2017-04-28: 20 mL via PERINEURAL

## 2017-04-28 MED ORDER — PHENYLEPHRINE 40 MCG/ML (10ML) SYRINGE FOR IV PUSH (FOR BLOOD PRESSURE SUPPORT)
PREFILLED_SYRINGE | INTRAVENOUS | Status: DC | PRN
  Administered 2017-04-28 (×6): 80 ug via INTRAVENOUS

## 2017-04-28 MED ORDER — SODIUM CHLORIDE 0.9 % IR SOLN
Status: DC | PRN
  Administered 2017-04-28 (×2): 1000 mL

## 2017-04-28 MED ORDER — LIDOCAINE 2% (20 MG/ML) 5 ML SYRINGE
INTRAMUSCULAR | Status: AC
  Filled 2017-04-28: qty 5

## 2017-04-28 MED ORDER — MENTHOL 3 MG MT LOZG: 1.0000 | LOZENGE | OROMUCOSAL | Status: DC | PRN

## 2017-04-28 MED ORDER — KETAMINE HCL 10 MG/ML IJ SOLN
INTRAMUSCULAR | Status: AC
  Filled 2017-04-28: qty 1

## 2017-04-28 MED ORDER — BUPIVACAINE HCL (PF) 0.25 % IJ SOLN
INTRAMUSCULAR | Status: AC
  Filled 2017-04-28: qty 60

## 2017-04-28 MED ORDER — SUGAMMADEX SODIUM 200 MG/2ML IV SOLN
INTRAVENOUS | Status: DC | PRN
  Administered 2017-04-28: 200 mg via INTRAVENOUS

## 2017-04-28 MED ORDER — LACTATED RINGERS IV SOLN
INTRAVENOUS | Status: DC
  Administered 2017-04-28 (×2): via INTRAVENOUS

## 2017-04-28 MED ORDER — ONDANSETRON HCL 4 MG/2ML IJ SOLN
INTRAMUSCULAR | Status: DC | PRN
  Administered 2017-04-28: 4 mg via INTRAVENOUS

## 2017-04-28 MED ORDER — ONDANSETRON HCL 4 MG/2ML IJ SOLN
INTRAMUSCULAR | Status: AC
  Filled 2017-04-28: qty 2

## 2017-04-28 MED ORDER — IRBESARTAN 150 MG PO TABS
150.0000 mg | ORAL_TABLET | Freq: Every day | ORAL | Status: DC
  Administered 2017-04-29 – 2017-04-30 (×2): 150 mg via ORAL
  Filled 2017-04-28 (×2): qty 1

## 2017-04-28 MED ORDER — CEFAZOLIN SODIUM-DEXTROSE 2-4 GM/100ML-% IV SOLN
2.0000 g | Freq: Four times a day (QID) | INTRAVENOUS | Status: AC
  Administered 2017-04-28 – 2017-04-29 (×3): 2 g via INTRAVENOUS
  Filled 2017-04-28 (×3): qty 100

## 2017-04-28 MED ORDER — ACETAMINOPHEN 10 MG/ML IV SOLN
1000.0000 mg | Freq: Four times a day (QID) | INTRAVENOUS | Status: AC
  Administered 2017-04-28: 1000 mg via INTRAVENOUS

## 2017-04-28 MED ORDER — SODIUM CHLORIDE 0.9 % IV SOLN
INTRAVENOUS | Status: AC
  Filled 2017-04-28: qty 500000

## 2017-04-28 MED ORDER — SUFENTANIL CITRATE 50 MCG/ML IV SOLN
INTRAVENOUS | Status: AC
  Filled 2017-04-28: qty 1

## 2017-04-28 MED ORDER — DEXAMETHASONE SODIUM PHOSPHATE 10 MG/ML IJ SOLN
INTRAMUSCULAR | Status: DC | PRN
  Administered 2017-04-28: 10 mg via INTRAVENOUS

## 2017-04-28 MED ORDER — PHENYLEPHRINE 40 MCG/ML (10ML) SYRINGE FOR IV PUSH (FOR BLOOD PRESSURE SUPPORT)
PREFILLED_SYRINGE | INTRAVENOUS | Status: AC
  Filled 2017-04-28: qty 10

## 2017-04-28 MED ORDER — NITROGLYCERIN 0.4 MG SL SUBL: 0.4000 mg | SUBLINGUAL_TABLET | SUBLINGUAL | Status: DC | PRN

## 2017-04-28 MED ORDER — DIPHENHYDRAMINE HCL 12.5 MG/5ML PO ELIX: 12.5000 mg | ORAL_SOLUTION | ORAL | Status: DC | PRN

## 2017-04-28 MED ORDER — PROPOFOL 10 MG/ML IV BOLUS
INTRAVENOUS | Status: DC | PRN
  Administered 2017-04-28: 30 mg via INTRAVENOUS
  Administered 2017-04-28: 170 mg via INTRAVENOUS

## 2017-04-28 MED ORDER — HYDROMORPHONE HCL 1 MG/ML IJ SOLN
0.2500 mg | INTRAMUSCULAR | Status: DC | PRN
  Administered 2017-04-28 (×4): 0.5 mg via INTRAVENOUS

## 2017-04-28 MED ORDER — DOCUSATE SODIUM 100 MG PO CAPS
100.0000 mg | ORAL_CAPSULE | Freq: Two times a day (BID) | ORAL | Status: DC
  Administered 2017-04-28 – 2017-04-30 (×4): 100 mg via ORAL
  Filled 2017-04-28 (×4): qty 1

## 2017-04-28 MED ORDER — ALUM & MAG HYDROXIDE-SIMETH 200-200-20 MG/5ML PO SUSP: 30.0000 mL | ORAL | Status: DC | PRN

## 2017-04-28 MED ORDER — SUCCINYLCHOLINE CHLORIDE 200 MG/10ML IV SOSY
PREFILLED_SYRINGE | INTRAVENOUS | Status: AC
  Filled 2017-04-28: qty 10

## 2017-04-28 MED ORDER — METHOCARBAMOL 1000 MG/10ML IJ SOLN
500.0000 mg | Freq: Four times a day (QID) | INTRAVENOUS | Status: DC | PRN
  Administered 2017-04-28: 500 mg via INTRAVENOUS
  Filled 2017-04-28: qty 550

## 2017-04-28 MED ORDER — HYDROMORPHONE HCL 1 MG/ML IJ SOLN
INTRAMUSCULAR | Status: AC
  Administered 2017-04-28: 0.5 mg via INTRAVENOUS
  Filled 2017-04-28: qty 2

## 2017-04-28 MED ORDER — FENTANYL CITRATE (PF) 100 MCG/2ML IJ SOLN
INTRAMUSCULAR | Status: DC | PRN
  Administered 2017-04-28: 100 ug via INTRAVENOUS

## 2017-04-28 MED ORDER — RIVAROXABAN 10 MG PO TABS
10.0000 mg | ORAL_TABLET | Freq: Every day | ORAL | Status: DC
  Administered 2017-04-29 – 2017-04-30 (×2): 10 mg via ORAL
  Filled 2017-04-28 (×2): qty 1

## 2017-04-28 MED ORDER — RISAQUAD PO CAPS
1.0000 | ORAL_CAPSULE | Freq: Every day | ORAL | Status: DC
  Administered 2017-04-28 – 2017-04-30 (×3): 1 via ORAL
  Filled 2017-04-28 (×3): qty 1

## 2017-04-28 MED ORDER — FENTANYL CITRATE (PF) 100 MCG/2ML IJ SOLN
INTRAMUSCULAR | Status: AC
  Filled 2017-04-28: qty 2

## 2017-04-28 MED ORDER — BUPIVACAINE-EPINEPHRINE (PF) 0.5% -1:200000 IJ SOLN
INTRAMUSCULAR | Status: AC
  Filled 2017-04-28: qty 30

## 2017-04-28 MED ORDER — PSYLLIUM 0.52 G PO CAPS: 0.5200 g | ORAL_CAPSULE | Freq: Two times a day (BID) | ORAL | Status: DC

## 2017-04-28 MED ORDER — KCL IN DEXTROSE-NACL 20-5-0.45 MEQ/L-%-% IV SOLN
INTRAVENOUS | Status: AC
  Administered 2017-04-28: 14:00:00 via INTRAVENOUS
  Filled 2017-04-28 (×2): qty 1000

## 2017-04-28 MED ORDER — BUPIVACAINE-EPINEPHRINE 0.5% -1:200000 IJ SOLN
INTRAMUSCULAR | Status: DC | PRN
  Administered 2017-04-28: 30 mL

## 2017-04-28 MED ORDER — MAGNESIUM CITRATE PO SOLN: 1.0000 | Freq: Once | ORAL | Status: DC | PRN

## 2017-04-28 MED ORDER — SODIUM CHLORIDE 0.9 % IR SOLN
Status: DC | PRN
  Administered 2017-04-28: 1000 mL

## 2017-04-28 MED ORDER — MIDAZOLAM HCL 2 MG/2ML IJ SOLN
INTRAMUSCULAR | Status: DC | PRN
  Administered 2017-04-28: 2 mg via INTRAVENOUS

## 2017-04-28 MED ORDER — ROCURONIUM BROMIDE 10 MG/ML (PF) SYRINGE
PREFILLED_SYRINGE | INTRAVENOUS | Status: DC | PRN
  Administered 2017-04-28: 10 mg via INTRAVENOUS
  Administered 2017-04-28: 60 mg via INTRAVENOUS

## 2017-04-28 MED ORDER — FAMOTIDINE 20 MG PO TABS
20.0000 mg | ORAL_TABLET | Freq: Two times a day (BID) | ORAL | Status: DC
  Administered 2017-04-28 – 2017-04-30 (×4): 20 mg via ORAL
  Filled 2017-04-28 (×4): qty 1

## 2017-04-28 MED ORDER — OXYCODONE HCL 5 MG PO TABS
5.0000 mg | ORAL_TABLET | ORAL | Status: DC | PRN
  Administered 2017-04-29 (×2): 5 mg via ORAL
  Administered 2017-04-30 (×3): 10 mg via ORAL
  Filled 2017-04-28 (×2): qty 2
  Filled 2017-04-28 (×2): qty 1
  Filled 2017-04-28 (×2): qty 2

## 2017-04-28 MED ORDER — POLYETHYLENE GLYCOL 3350 17 G PO PACK: 17.0000 g | PACK | Freq: Every day | ORAL | Status: DC | PRN

## 2017-04-28 MED ORDER — ACETAMINOPHEN 10 MG/ML IV SOLN
INTRAVENOUS | Status: AC
  Administered 2017-04-28: 1000 mg via INTRAVENOUS
  Filled 2017-04-28: qty 100

## 2017-04-28 MED ORDER — DILTIAZEM HCL ER COATED BEADS 180 MG PO CP24
180.0000 mg | ORAL_CAPSULE | Freq: Every day | ORAL | Status: DC
  Administered 2017-04-28 – 2017-04-29 (×2): 180 mg via ORAL
  Filled 2017-04-28 (×2): qty 1

## 2017-04-28 MED ORDER — HYDROMORPHONE HCL 1 MG/ML IJ SOLN
0.5000 mg | INTRAMUSCULAR | Status: DC | PRN
  Administered 2017-04-28: 1 mg via INTRAVENOUS

## 2017-04-28 MED ORDER — BISACODYL 5 MG PO TBEC: 5.0000 mg | DELAYED_RELEASE_TABLET | Freq: Every day | ORAL | Status: DC | PRN

## 2017-04-28 MED ORDER — SODIUM CHLORIDE 0.9 % IV SOLN
INTRAVENOUS | Status: DC | PRN
  Administered 2017-04-28: 500 mL

## 2017-04-28 MED ORDER — GABAPENTIN 300 MG PO CAPS
300.0000 mg | ORAL_CAPSULE | Freq: Three times a day (TID) | ORAL | Status: DC
  Administered 2017-04-28 – 2017-04-30 (×6): 300 mg via ORAL
  Filled 2017-04-28 (×7): qty 1

## 2017-04-28 MED ORDER — ACETAMINOPHEN 10 MG/ML IV SOLN
1000.0000 mg | INTRAVENOUS | Status: AC
  Administered 2017-04-28: 1000 mg via INTRAVENOUS
  Filled 2017-04-28: qty 100

## 2017-04-28 MED ORDER — DEXAMETHASONE SODIUM PHOSPHATE 10 MG/ML IJ SOLN
INTRAMUSCULAR | Status: AC
  Filled 2017-04-28: qty 1

## 2017-04-28 MED ORDER — ONDANSETRON HCL 4 MG/2ML IJ SOLN
4.0000 mg | Freq: Four times a day (QID) | INTRAMUSCULAR | Status: DC | PRN
Start: 2017-04-28 — End: 2017-04-30

## 2017-04-28 MED ORDER — DIPHENHYDRAMINE HCL 25 MG PO CAPS: 25.0000 mg | ORAL_CAPSULE | Freq: Every day | ORAL | Status: DC | PRN

## 2017-04-28 SURGICAL SUPPLY — 69 items
BAG DECANTER FOR FLEXI CONT (MISCELLANEOUS) ×2 IMPLANT
BAG ZIPLOCK 12X15 (MISCELLANEOUS) IMPLANT
BANDAGE ACE 4X5 VEL STRL LF (GAUZE/BANDAGES/DRESSINGS) ×2 IMPLANT
BANDAGE ACE 6X5 VEL STRL LF (GAUZE/BANDAGES/DRESSINGS) ×2 IMPLANT
BLADE SAG 18X100X1.27 (BLADE) ×2 IMPLANT
BLADE SAW SGTL 11.0X1.19X90.0M (BLADE) ×2 IMPLANT
BLADE SAW SGTL 13.0X1.19X90.0M (BLADE) ×2 IMPLANT
BNDG COHESIVE 4X5 TAN STRL (GAUZE/BANDAGES/DRESSINGS) ×2 IMPLANT
CAPT KNEE TOTAL 3 ATTUNE ×2 IMPLANT
CEMENT HV SMART SET (Cement) ×4 IMPLANT
CLOTH 2% CHLOROHEXIDINE 3PK (PERSONAL CARE ITEMS) ×2 IMPLANT
COVER SURGICAL LIGHT HANDLE (MISCELLANEOUS) ×2 IMPLANT
CUFF TOURN SGL QUICK 34 (TOURNIQUET CUFF) ×1
CUFF TRNQT CYL 34X4X40X1 (TOURNIQUET CUFF) ×1 IMPLANT
DECANTER SPIKE VIAL GLASS SM (MISCELLANEOUS) IMPLANT
DRAPE INCISE IOBAN 66X45 STRL (DRAPES) IMPLANT
DRAPE LG THREE QUARTER DISP (DRAPES) ×2 IMPLANT
DRAPE ORTHO SPLIT 77X108 STRL (DRAPES) ×2
DRAPE SHEET LG 3/4 BI-LAMINATE (DRAPES) ×6 IMPLANT
DRAPE SURG ORHT 6 SPLT 77X108 (DRAPES) ×2 IMPLANT
DRAPE TOP 10253 STERILE (DRAPES) IMPLANT
DRAPE U-SHAPE 47X51 STRL (DRAPES) ×2 IMPLANT
DRSG AQUACEL AG ADV 3.5X10 (GAUZE/BANDAGES/DRESSINGS) ×2 IMPLANT
DRSG TEGADERM 4X4.75 (GAUZE/BANDAGES/DRESSINGS) ×2 IMPLANT
DURAPREP 26ML APPLICATOR (WOUND CARE) ×2 IMPLANT
ELECT BLADE TIP CTD 4 INCH (ELECTRODE) ×2 IMPLANT
ELECT REM PT RETURN 15FT ADLT (MISCELLANEOUS) ×2 IMPLANT
EVACUATOR 1/8 PVC DRAIN (DRAIN) IMPLANT
GAUZE SPONGE 2X2 8PLY STRL LF (GAUZE/BANDAGES/DRESSINGS) IMPLANT
GLOVE BIOGEL PI IND STRL 7.0 (GLOVE) ×3 IMPLANT
GLOVE BIOGEL PI IND STRL 7.5 (GLOVE) ×2 IMPLANT
GLOVE BIOGEL PI IND STRL 8 (GLOVE) IMPLANT
GLOVE BIOGEL PI INDICATOR 7.0 (GLOVE) ×3
GLOVE BIOGEL PI INDICATOR 7.5 (GLOVE) ×2
GLOVE BIOGEL PI INDICATOR 8 (GLOVE)
GLOVE ECLIPSE 8.0 STRL XLNG CF (GLOVE) IMPLANT
GLOVE SURG SS PI 7.0 STRL IVOR (GLOVE) ×2 IMPLANT
GLOVE SURG SS PI 7.5 STRL IVOR (GLOVE) IMPLANT
GLOVE SURG SS PI 8.0 STRL IVOR (GLOVE) ×4 IMPLANT
GOWN STRL REUS W/TWL XL LVL3 (GOWN DISPOSABLE) ×6 IMPLANT
HANDPIECE INTERPULSE COAX TIP (DISPOSABLE) ×1
HEMOSTAT SPONGE AVITENE ULTRA (HEMOSTASIS) ×2 IMPLANT
IMMOBILIZER KNEE 20 (SOFTGOODS) ×2
IMMOBILIZER KNEE 20 THIGH 36 (SOFTGOODS) ×1 IMPLANT
MANIFOLD NEPTUNE II (INSTRUMENTS) ×2 IMPLANT
NS IRRIG 1000ML POUR BTL (IV SOLUTION) IMPLANT
PACK TOTAL KNEE CUSTOM (KITS) ×2 IMPLANT
POSITIONER SURGICAL ARM (MISCELLANEOUS) ×2 IMPLANT
SEALER BIPOLAR AQUA 6.0 (INSTRUMENTS) ×2 IMPLANT
SET HNDPC FAN SPRY TIP SCT (DISPOSABLE) ×1 IMPLANT
SPONGE GAUZE 2X2 STER 10/PKG (GAUZE/BANDAGES/DRESSINGS)
SPONGE LAP 18X18 X RAY DECT (DISPOSABLE) ×4 IMPLANT
SPONGE SURGIFOAM ABS GEL 100 (HEMOSTASIS) IMPLANT
STAPLER VISISTAT (STAPLE) ×2 IMPLANT
STRIP CLOSURE SKIN 1/2X4 (GAUZE/BANDAGES/DRESSINGS) IMPLANT
SUT BONE WAX W31G (SUTURE) ×2 IMPLANT
SUT MNCRL AB 4-0 PS2 18 (SUTURE) ×2 IMPLANT
SUT STRATAFIX 0 PDS 27 VIOLET (SUTURE) ×2
SUT VIC AB 1 CT1 27 (SUTURE) ×4
SUT VIC AB 1 CT1 27XBRD ANTBC (SUTURE) ×4 IMPLANT
SUT VIC AB 2-0 CT1 27 (SUTURE) ×3
SUT VIC AB 2-0 CT1 TAPERPNT 27 (SUTURE) ×3 IMPLANT
SUTURE STRATFX 0 PDS 27 VIOLET (SUTURE) ×1 IMPLANT
SYR 50ML LL SCALE MARK (SYRINGE) IMPLANT
TOWER CARTRIDGE SMART MIX (DISPOSABLE) ×2 IMPLANT
TRAY FOLEY W/METER SILVER 16FR (SET/KITS/TRAYS/PACK) ×2 IMPLANT
WATER STERILE IRR 1000ML POUR (IV SOLUTION) ×2 IMPLANT
WRAP KNEE MAXI GEL POST OP (GAUZE/BANDAGES/DRESSINGS) ×2 IMPLANT
YANKAUER SUCT BULB TIP 10FT TU (MISCELLANEOUS) ×2 IMPLANT

## 2017-04-28 NOTE — Evaluation (Signed)
Physical Therapy Evaluation Patient Details Name: Nathan Haney MRN: [id number] DOB: September 01, 1940 Today's Date: 04/28/2017   History of Present Illness  77 yo male s/p L TKA 04/28/17. Hx of A fib, ETOH abuse, TIA, meningioma, RBBB  Clinical Impression  On eval POD 0, pt required Min assist +2 safety/equipment for mobility. He walked ~15 feet with a RW. Moderate pain with activity. Pt c/o lightheadedness during session. Will follow and progress activity as tolerated. Per chart, plan is for OP PT.     Follow Up Recommendations Follow surgeon's recommendation for DC plan and follow-up therapies    Equipment Recommendations  None recommended by PT    Recommendations for Other Services       Precautions / Restrictions Precautions Precautions: Fall;Knee Required Braces or Orthoses: Knee Immobilizer - Left Restrictions Weight Bearing Restrictions: No      Mobility  Bed Mobility Overal bed mobility: Needs Assistance Bed Mobility: Supine to Sit     Supine to sit: Min assist;HOB elevated     General bed mobility comments: Assist for L LE. Increased time.   Transfers Overall transfer level: Needs assistance Equipment used: Rolling walker (2 wheeled) Transfers: Sit to/from Stand Sit to Stand: Min assist;From elevated surface         General transfer comment: Assist to rise, stabilize, control descent. VCs safety, technique, hand/LE placement  Ambulation/Gait Ambulation/Gait assistance: Min assist;+2 safety/equipment Ambulation Distance (Feet): 15 Feet Assistive device: Rolling walker (2 wheeled) Gait Pattern/deviations: Step-to pattern;Antalgic     General Gait Details: VCs safety, technique, sequence. Assist to stabilize pt. Pt c/o lightheadedness. Followed with recliner for safety. transported back to room with recliner.   Stairs            Wheelchair Mobility    Modified Rankin (Stroke Patients Only)       Balance Overall balance assessment: Needs  assistance         Standing balance support: Bilateral upper extremity supported Standing balance-Leahy Scale: Poor                               Pertinent Vitals/Pain Pain Assessment: 0-10 Pain Score: 5  Pain Location: L knee Pain Descriptors / Indicators: Aching;Sore Pain Intervention(s): Limited activity within patient's tolerance;Repositioned;Ice applied    Home Living Family/patient expects to be discharged to:: Private residence Living Arrangements: Spouse/significant other Available Help at Discharge: Family Type of Home: House Home Access: Stairs to enter Entrance Stairs-Rails: Right Entrance Stairs-Number of Steps: 3 Home Layout: Multi-level;Able to live on main level with bedroom/bathroom Home Equipment: Walker - 2 wheels;Grab bars - toilet;Grab bars - tub/shower      Prior Function Level of Independence: Independent               Hand Dominance        Extremity/Trunk Assessment   Upper Extremity Assessment Upper Extremity Assessment: Overall WFL for tasks assessed    Lower Extremity Assessment Lower Extremity Assessment: Generalized weakness(s/p L TKA)    Cervical / Trunk Assessment Cervical / Trunk Assessment: Normal  Communication   Communication: No difficulties  Cognition Arousal/Alertness: Awake/alert Behavior During Therapy: WFL for tasks assessed/performed Overall Cognitive Status: Within Functional Limits for tasks assessed                                        General Comments  Exercises     Assessment/Plan    PT Assessment Patient needs continued PT services  PT Problem List Decreased strength;Decreased range of motion;Decreased balance;Decreased mobility;Decreased activity tolerance;Pain;Decreased knowledge of use of DME       PT Treatment Interventions DME instruction;Functional mobility training;Balance training;Patient/family education;Therapeutic activities;Gait training;Therapeutic  exercise    PT Goals (Current goals can be found in the Care Plan section)  Acute Rehab PT Goals Patient Stated Goal: regain independence. less pain. PT Goal Formulation: With patient/family Time For Goal Achievement: 05/12/17 Potential to Achieve Goals: Good    Frequency     Barriers to discharge        Co-evaluation               AM-PAC PT "6 Clicks" Daily Activity  Outcome Measure Difficulty turning over in bed (including adjusting bedclothes, sheets and blankets)?: A Lot Difficulty moving from lying on back to sitting on the side of the bed? : Unable Difficulty sitting down on and standing up from a chair with arms (e.g., wheelchair, bedside commode, etc,.)?: Unable Help needed moving to and from a bed to chair (including a wheelchair)?: A Little Help needed walking in hospital room?: A Little Help needed climbing 3-5 steps with a railing? : A Little 6 Click Score: 13    End of Session Equipment Utilized During Treatment: Gait belt;Left knee immobilizer Activity Tolerance: Patient tolerated treatment well Patient left: in chair;with call bell/phone within reach;with family/visitor present   PT Visit Diagnosis: Muscle weakness (generalized) (M62.81);Difficulty in walking, not elsewhere classified (R26.2);Pain Pain - Right/Left: Left Pain - part of body: Knee    Time: 2831-5176 PT Time Calculation (min) (ACUTE ONLY): 19 min   Charges:   PT Evaluation $PT Eval Low Complexity: 1 Low     PT G Codes:         Weston Anna, MPT Pager: [phone]

## 2017-04-28 NOTE — Brief Op Note (Signed)
04/28/2017  9:36 AM  PATIENT:  Nathan Haney  77 y.o. male  PRE-OPERATIVE DIAGNOSIS:  Degenerative joint disease left knee  POST-OPERATIVE DIAGNOSIS:  Degenerative joint disease left knee  PROCEDURE:  Procedure(s) with comments: LEFT TOTAL KNEE ARTHROPLASTY (Left) - 150 mins  SURGEON:  Surgeon(s) and Role:    Susa Day, MD - Primary  PHYSICIAN ASSISTANT:   ASSISTANTS: [place]iss[place]   ANESTHESIA:   general  EBL:  250 mL   BLOOD ADMINISTERED:none  DRAINS: none   LOCAL MEDICATIONS USED:  MARCAINE     SPECIMEN:  No Specimen  DISPOSITION OF SPECIMEN:  N/A  COUNTS:  YES  TOURNIQUET:   Total Tourniquet Time Documented: Thigh (Left) - 62 minutes Total: Thigh (Left) - 62 minutes   DICTATION: .Other Dictation: Dictation Number  [id number]  PLAN OF CARE: Discharge to home after PACU  PATIENT DISPOSITION:  PACU - hemodynamically stable.   Delay start of Pharmacological VTE agent (>24hrs) due to surgical blood loss or risk of bleeding: yes

## 2017-04-28 NOTE — Interval H&P Note (Signed)
History and Physical Interval Note:  04/28/2017 7:17 AM  Nathan Haney  has presented today for surgery, with the diagnosis of Degenerative joint disease left knee  The various methods of treatment have been discussed with the patient and family. After consideration of risks, benefits and other options for treatment, the patient has consented to  Procedure(s) with comments: LEFT TOTAL KNEE ARTHROPLASTY (Left) - 150 mins as a surgical intervention .  The patient's history has been reviewed, patient examined, no change in status, stable for surgery.  I have reviewed the patient's chart and labs.  Questions were answered to the patient's satisfaction.     [name] C

## 2017-04-28 NOTE — Anesthesia Postprocedure Evaluation (Signed)
Anesthesia Post Note  Patient: Nathan Haney  Procedure(s) Performed: LEFT TOTAL KNEE ARTHROPLASTY (Left Knee)     Patient location during evaluation: PACU Anesthesia Type: General Level of consciousness: awake and alert Pain management: pain level controlled Vital Signs Assessment: post-procedure vital signs reviewed and stable Respiratory status: spontaneous breathing, nonlabored ventilation, respiratory function stable and patient connected to nasal cannula oxygen Cardiovascular status: blood pressure returned to baseline and stable Postop Assessment: no apparent nausea or vomiting Anesthetic complications: no    Last Vitals:  Vitals:   04/28/17 1329 04/28/17 1352  BP: (!) 155/81 (!) 169/85  Pulse: 74 77  Resp: (!) 22 17  Temp: 36.6 C (!) 35.8 C  SpO2: 100%     Last Pain:  Vitals:   04/28/17 1329  TempSrc:   PainSc: 2                  Effie Berkshire

## 2017-04-28 NOTE — Discharge Instructions (Signed)
Elevate leg above heart 6x a day for 20minutes each °Use knee immobilizer while walking until can SLR x 10 °Use knee immobilizer in bed to keep knee in extension °Aquacel dressing may remain in place until follow up. May shower with aquacel dressing in place. If the dressing becomes saturated or peels off, you may remove aquacel dressing. Do not remove steri-strips if they are present. Place new dressing with gauze and tape or ACE bandage which should be kept clean and dry and changed daily. ° °INSTRUCTIONS AFTER JOINT REPLACEMENT  ° °o Remove items at home which could result in a fall. This includes throw rugs or furniture in walking pathways °o ICE to the affected joint every three hours while awake for 30 minutes at a time, for at least the first 3-5 days, and then as needed for pain and swelling.  Continue to use ice for pain and swelling. You may notice swelling that will progress down to the foot and ankle.  This is normal after surgery.  Elevate your leg when you are not up walking on it.   °o Continue to use the breathing machine you got in the hospital (incentive spirometer) which will help keep your temperature down.  It is common for your temperature to cycle up and down following surgery, especially at night when you are not up moving around and exerting yourself.  The breathing machine keeps your lungs expanded and your temperature down. ° ° °DIET:  As you were doing prior to hospitalization, we recommend a well-balanced diet. ° °DRESSING / WOUND CARE / SHOWERING ° °Keep the surgical dressing until follow up.  The dressing is water proof, so you can shower without any extra covering.  IF THE DRESSING FALLS OFF or the wound gets wet inside, change the dressing with sterile gauze.  Please use good hand washing techniques before changing the dressing.  Do not use any lotions or creams on the incision until instructed by your surgeon.   ° °ACTIVITY ° °o Increase activity slowly as tolerated, but follow the  weight bearing instructions below.   °o No driving for 6 weeks or until further direction given by your physician.  You cannot drive while taking narcotics.  °o No lifting or carrying greater than 10 lbs. until further directed by your surgeon. °o Avoid periods of inactivity such as sitting longer than an hour when not asleep. This helps prevent blood clots.  °o You may return to work once you are authorized by your doctor.  ° ° ° °WEIGHT BEARING  ° °Weight bearing as tolerated with assist device (walker, cane, etc) as directed, use it as long as suggested by your surgeon or therapist, typically at least 4-6 weeks. ° ° °EXERCISES ° °Results after joint replacement surgery are often greatly improved when you follow the exercise, range of motion and muscle strengthening exercises prescribed by your doctor. Safety measures are also important to protect the joint from further injury. Any time any of these exercises cause you to have increased pain or swelling, decrease what you are doing until you are comfortable again and then slowly increase them. If you have problems or questions, call your caregiver or physical therapist for advice.  ° °Rehabilitation is important following a joint replacement. After just a few days of immobilization, the muscles of the leg can become weakened and shrink (atrophy).  These exercises are designed to build up the tone and strength of the thigh and leg muscles and to improve motion. Often   times heat used for twenty to thirty minutes before working out will loosen up your tissues and help with improving the range of motion but do not use heat for the first two weeks following surgery (sometimes heat can increase post-operative swelling).  ° °These exercises can be done on a training (exercise) mat, on the floor, on a table or on a bed. Use whatever works the best and is most comfortable for you.    Use music or television while you are exercising so that the exercises are a pleasant  break in your day. This will make your life better with the exercises acting as a break in your routine that you can look forward to.   Perform all exercises about fifteen times, three times per day or as directed.  You should exercise both the operative leg and the other leg as well. ° °Exercises include: °  °• Quad Sets - Tighten up the muscle on the front of the thigh (Quad) and hold for 5-10 seconds.   °• Straight Leg Raises - With your knee straight (if you were given a brace, keep it on), lift the leg to 60 degrees, hold for 3 seconds, and slowly lower the leg.  Perform this exercise against resistance later as your leg gets stronger.  °• Leg Slides: Lying on your back, slowly slide your foot toward your buttocks, bending your knee up off the floor (only go as far as is comfortable). Then slowly slide your foot back down until your leg is flat on the floor again.  °• Angel Wings: Lying on your back spread your legs to the side as far apart as you can without causing discomfort.  °• Hamstring Strength:  Lying on your back, push your heel against the floor with your leg straight by tightening up the muscles of your buttocks.  Repeat, but this time bend your knee to a comfortable angle, and push your heel against the floor.  You may put a pillow under the heel to make it more comfortable if necessary.  ° °A rehabilitation program following joint replacement surgery can speed recovery and prevent re-injury in the future due to weakened muscles. Contact your doctor or a physical therapist for more information on knee rehabilitation.  ° ° °CONSTIPATION ° °Constipation is defined medically as fewer than three stools per week and severe constipation as less than one stool per week.  Even if you have a regular bowel pattern at home, your normal regimen is likely to be disrupted due to multiple reasons following surgery.  Combination of anesthesia, postoperative narcotics, change in appetite and fluid intake all can  affect your bowels.  ° °YOU MUST use at least one of the following options; they are listed in order of increasing strength to get the job done.  They are all available over the counter, and you may need to use some, POSSIBLY even all of these options:   ° °Drink plenty of fluids (prune juice may be helpful) and high fiber foods °Colace 100 mg by mouth twice a day  °Senokot for constipation as directed and as needed Dulcolax (bisacodyl), take with full glass of water  °Miralax (polyethylene glycol) once or twice a day as needed. ° °If you have tried all these things and are unable to have a bowel movement in the first 3-4 days after surgery call either your surgeon or your primary doctor.   ° °If you experience loose stools or diarrhea, hold the medications until you stool   forms back up.  If your symptoms do not get better within 1 week or if they get worse, check with your doctor.  If you experience "the worst abdominal pain ever" or develop nausea or vomiting, please contact the office immediately for further recommendations for treatment.   ITCHING:  If you experience itching with your medications, try taking only a single pain pill, or even half a pain pill at a time.  You can also use Benadryl over the counter for itching or also to help with sleep.   TED HOSE STOCKINGS:  Use stockings on both legs until for at least 2 weeks or as directed by physician office. They may be removed at night for sleeping.  MEDICATIONS:  See your medication summary on the After Visit Summary that nursing will review with you.  You may have some home medications which will be placed on hold until you complete the course of blood thinner medication.  It is important for you to complete the blood thinner medication as prescribed.  PRECAUTIONS:  If you experience chest pain or shortness of breath - call 911 immediately for transfer to the hospital emergency department.   If you develop a fever greater that 101 F, purulent  drainage from wound, increased redness or drainage from wound, foul odor from the wound/dressing, or calf pain - CONTACT YOUR SURGEON.                                                   FOLLOW-UP APPOINTMENTS:  If you do not already have a post-op appointment, please call the office for an appointment to be seen by your surgeon.  Guidelines for how soon to be seen are listed in your After Visit Summary, but are typically between 1-4 weeks after surgery.  OTHER INSTRUCTIONS:   Knee Replacement:  Do not place pillow under knee, focus on keeping the knee straight while resting. CPM instructions: 0-90 degrees, 2 hours in the morning, 2 hours in the afternoon, and 2 hours in the evening. Place foam block, curve side up under heel at all times except when in CPM or when walking.  DO NOT modify, tear, cut, or change the foam block in any way.  MAKE SURE YOU:   Understand these instructions.   Get help right away if you are not doing well or get worse.    Thank you for letting us be a part of your medical care team.  It is a privilege we respect greatly.  We hope these instructions will help you stay on track for a fast and full recovery!   Information on my medicine - XARELTO (Rivaroxaban)   Why was Xarelto prescribed for you? Xarelto was prescribed for you to reduce the risk of blood clots forming after orthopedic surgery. The medical term for these abnormal blood clots is venous thromboembolism (VTE).  What do you need to know about xarelto ? Take your Xarelto ONCE DAILY at the same time every day. You may take it either with or without food.  If you have difficulty swallowing the tablet whole, you may crush it and mix in applesauce just prior to taking your dose.  Take Xarelto exactly as prescribed by your doctor and DO NOT stop taking Xarelto without talking to the doctor who prescribed the medication.  Stopping without other VTE prevention medication to  take the place of Xarelto  may increase your risk of developing a clot.  After discharge, you should have regular check-up appointments with your healthcare provider that is prescribing your Xarelto.    What do you do if you miss a dose? If you miss a dose, take it as soon as you remember on the same day then continue your regularly scheduled once daily regimen the next day. Do not take two doses of Xarelto on the same day.   Important Safety Information A possible side effect of Xarelto is bleeding. You should call your healthcare provider right away if you experience any of the following: ? Bleeding from an injury or your nose that does not stop. ? Unusual colored urine (red or dark brown) or unusual colored stools (red or black). ? Unusual bruising for unknown reasons. ? A serious fall or if you hit your head (even if there is no bleeding).  Some medicines may interact with Xarelto and might increase your risk of bleeding while on Xarelto. To help avoid this, consult your healthcare provider or pharmacist prior to using any new prescription or non-prescription medications, including herbals, vitamins, non-steroidal anti-inflammatory drugs (NSAIDs) and supplements.  This website has more information on Xarelto: [email or web address]

## 2017-04-28 NOTE — Op Note (Signed)
NAME:  Nathan Haney, Nathan Haney NO.:  [id number]  MEDICAL RECORD NO.:  [id number]  LOCATION:                                 FACILITY:  PHYSICIAN:  Susa Day, M.D.         DATE OF BIRTH:  DATE OF PROCEDURE:  04/28/2017 DATE OF DISCHARGE:                              OPERATIVE REPORT   PREOPERATIVE DIAGNOSIS:  End-stage osteoarthrosis of the left knee.  POSTOPERATIVE DIAGNOSIS:  End-stage osteoarthrosis of the left knee.  PROCEDURE PERFORMED:  Left total knee arthroplasty utilizing Attune DePuy rotating platform, 8 femur, 8 tibia, 6 insert, 41 patella.  ANESTHESIA:  General.  ASSISTANT:  Lacie Draft, PA.  HISTORY:  77 years old, end-stage osteoarthrosis of the left knee, bone- on-bone, negative affect to his activities of daily living.  Failing conservative treatment, indicated for replacement of the degenerated joint.  Risks and benefits discussed including bleeding, infection, damage to neurovascular structures, no change in symptoms, worsening symptoms, DVT, PE, anesthetic complications, etc.  TECHNIQUE:  With the patient in supine position after induction of adequate general anesthesia, 2 g of Kefzol.  The patient was examined. He had pulses distally in the posterior tibial and dorsalis pedis.  He had calcifications on his x-rays, mildly posteriorly.  Care was taken throughout the case to take this into account.  Left lower extremity was prepped and draped in usual sterile fashion.  Exsanguinated, thigh tourniquet inflated to 225 mmHg.  Midline incision was then made.  Full- thickness flaps developed.  Light cautery utilized to achieve hemostasis.  Median parapatellar arthrotomy performed.  Soft tissue elevated medially, protected the MCL.  Patella everted.  Knee flexed. Tricompartmental osteoarthrosis was noted, bone-on-bone.  Osteophytes removed with a rongeur.  Remnants of the medial and lateral menisci and the ACL were removed as well.  Step drill  was utilized and femoral canal was irrigated, 5-degree left was utilized, 10 off the distal femur due to flexion contracture.  Performed a distal femoral cut.  Tibia subluxed.  We measured the distal femur, this measured to an 8 with 3 degrees of external rotation.  Pins were placed.  We placed our distal femoral block cut and we performed anterior and posterior chamfer cuts, protecting soft tissues at all times posteriorly.  Tibia was subluxed anteriorly.  External alignment guide 2 off the defect, which was medially bisecting tibiotalar joint parallel to the joint, 3-degree slope.  We performed our cut.  We then used our extension block.  It was satisfactory.  The knee was then flexed, tibia subluxed, measured at 8 off the tibial tray just medial aspect of the tibial tubercle.  We used our base plate.  It was then pinned, drilled centrally, and our punch guide placed.  Turned attention towards the femur.  A box cut placed after bisecting the canal.  Next, we drilled our lug holes after a trial femur was placed.  A 6 mm insert.  We reduced it, had full extension, full flexion, good stability, varus and valgus stressing 0 to 30 degrees, negative anterior drawer.  I turned attention towards the patella, was measured at 25.  We planed it to a  15 utilizing external patellar jig.  Measured at 41, drilled our peg holes, medializing, and then placed trial patella, component reduced, and had excellent patellofemoral tracking.  We removed all trials.  I then checked posteriorly, remnants of menisci were removed and cauterized.  We used Aquamantys.  Through this case, the patient had some bleeding.  He was on preoperative Xarelto, off for 3 days.  Pressure was slightly high. We used this extensively to achieve strict hemostasis.  Knee was then flexed, all surfaces thoroughly dried.  Tibia subluxed.  Mixed cement on the back table in appropriate fashion, injected in the tibial canal. Digitally  pressurized and then we cemented and impacted 8 tibial tray and then cemented and impacted our femur.  6 mm insert, reduced, held an axial load throughout the curing of the cement.  We cemented and clamped our patella.  After curing the cement, tourniquet was deflated and any bleeding cauterized.  Excellent patellofemoral tracking and good stability.  We meticulously removed all redundant cement, irrigated with antibiotic irrigation.  No active bleeding.  Placed our permanent 6 insert, reduced it, had full extension, full flexion, good stability, varus and valgus stressing 0 to 30 degrees.  Negative anterior drawer. Slight flexion.  We then reapproximated the patellar arthrotomy with #1 Vicryl interrupted figure-of-eight sutures and then a running Stratafix following this and had excellent patellofemoral tracking.  Following this, flexion to gravity at 90 degrees.  Subcu with 2-0 and skin with staples.  Wound was dressed sterilely.  Had pulses distally.  Extubated without difficulty and transported to the recovery room in satisfactory condition.  The patient tolerated the procedure well.  No complications.  Assistant, Lacie Draft, P.A.  Tourniquet time was 62 minutes.     Susa Day, M.D.   ______________________________ Susa Day, M.D.    [name]  D:  04/28/2017  T:  04/28/2017  Job:  [id number]

## 2017-04-28 NOTE — Transfer of Care (Signed)
Immediate Anesthesia Transfer of Care Note  Patient: Nathan Haney  Procedure(s) Performed: LEFT TOTAL KNEE ARTHROPLASTY (Left Knee)  Patient Location: PACU  Anesthesia Type:GA combined with regional for post-op pain  Level of Consciousness: awake, alert  and oriented  Airway & Oxygen Therapy: Patient Spontanous Breathing and Patient connected to face mask oxygen  Post-op Assessment: Report given to RN and Post -op Vital signs reviewed and stable  Post vital signs: Reviewed and stable  Last Vitals:  Vitals Value Taken Time  BP 179/91 04/28/2017  9:54 AM  Temp    Pulse 84 04/28/2017  9:56 AM  Resp 15 04/28/2017  9:56 AM  SpO2 100 % 04/28/2017  9:56 AM  Vitals shown include unvalidated device data.  Last Pain:  Vitals:   04/28/17 0604  TempSrc: Oral  PainSc: 4       Patients Stated Pain Goal: 4 (77/37/36 6815)  Complications: No apparent anesthesia complications

## 2017-04-28 NOTE — Anesthesia Procedure Notes (Signed)
Procedure Name: Intubation Date/Time: 04/28/2017 7:40 AM Performed by: Sharlette Dense, CRNA Patient Re-evaluated:Patient Re-evaluated prior to induction Oxygen Delivery Method: Circle system utilized Preoxygenation: Pre-oxygenation with 100% oxygen Induction Type: IV induction Ventilation: Mask ventilation without difficulty and Oral airway inserted - appropriate to patient size Laryngoscope Size: Glidescope and 3 Grade View: Grade I Tube type: Parker flex tip Number of attempts: 1 Airway Equipment and Method: Video-laryngoscopy Placement Confirmation: ETT inserted through vocal cords under direct vision,  positive ETCO2 and breath sounds checked- equal and bilateral Secured at: 22 cm Tube secured with: Tape Dental Injury: Teeth and Oropharynx as per pre-operative assessment  Difficulty Due To: Difficulty was anticipated, Difficult Airway- due to reduced neck mobility and Difficult Airway- due to anterior larynx Future Recommendations: Recommend- induction with short-acting agent, and alternative techniques readily available

## 2017-04-28 NOTE — Anesthesia Preprocedure Evaluation (Addendum)
Anesthesia Evaluation  Patient identified by MRN, date of birth, ID band Patient awake    Reviewed: Allergy & Precautions, NPO status , Patient's Chart, lab work & pertinent test results  Airway Mallampati: II  TM Distance: >3 FB Neck ROM: Limited    Dental  (+) Teeth Intact, Dental Advisory Given   Pulmonary sleep apnea and Continuous Positive Airway Pressure Ventilation ,    breath sounds clear to auscultation       Cardiovascular hypertension, + CAD  + dysrhythmias Atrial Fibrillation  Rhythm:Regular Rate:Normal     Neuro/Psych TIAnegative psych ROS   GI/Hepatic Neg liver ROS, GERD  ,  Endo/Other  negative endocrine ROS  Renal/GU negative Renal ROS     Musculoskeletal  (+) Arthritis , Osteoarthritis,    Abdominal Normal abdominal exam  (+)   Peds  Hematology negative hematology ROS (+)   Anesthesia Other Findings   Reproductive/Obstetrics                            Lab Results  Component Value Date   WBC 7.6 04/19/2017   HGB 14.5 04/19/2017   HCT 43.7 04/19/2017   MCV 94.8 04/19/2017   PLT 243 04/19/2017   Lab Results  Component Value Date   CREATININE 0.98 04/19/2017   BUN 10 04/19/2017   NA 141 04/19/2017   K 4.3 04/19/2017   CL 103 04/19/2017   CO2 28 04/19/2017   Lab Results  Component Value Date   INR 0.98 04/28/2017   INR 1.74 04/19/2017   INR 1.0 10/14/2008     Anesthesia Physical Anesthesia Plan  ASA: III  Anesthesia Plan: General   Post-op Pain Management: GA combined w/ Regional for post-op pain   Induction: Intravenous  PONV Risk Score and Plan: 3 and Ondansetron, Dexamethasone and Midazolam  Airway Management Planned: Oral ETT and Video Laryngoscope Planned  Additional Equipment: None  Intra-op Plan:   Post-operative Plan: Extubation in OR  Informed Consent: I have reviewed the patients History and Physical, chart, labs and discussed the  procedure including the risks, benefits and alternatives for the proposed anesthesia with the patient or authorized representative who has indicated his/her understanding and acceptance.   Dental advisory given  Plan Discussed with: CRNA  Anesthesia Plan Comments:        Anesthesia Quick Evaluation

## 2017-04-28 NOTE — Anesthesia Procedure Notes (Signed)
Date/Time: 04/28/2017 7:00 AM Performed by: Sharlette Dense, CRNA Oxygen Delivery Method: Nasal cannula

## 2017-04-28 NOTE — Anesthesia Procedure Notes (Addendum)
Anesthesia Regional Block: Adductor canal block   Pre-Anesthetic Checklist: ,, timeout performed, Correct Patient, Correct Site, Correct Laterality, Correct Procedure, Correct Position, site marked, Risks and benefits discussed,  Surgical consent,  Pre-op evaluation,  At surgeon's request and post-op pain management  Laterality: Left  Prep: chloraprep       Needles:  Injection technique: Single-shot  Needle Type: Echogenic Needle     Needle Length: 9cm  Needle Gauge: 21     Additional Needles:   Procedures:,,,, ultrasound used (permanent image in chart),,,,  Narrative:  Start time: 04/28/2017 7:10 AM End time: 04/28/2017 7:20 AM Injection made incrementally with aspirations every 5 mL.  Performed by: Personally  Anesthesiologist: Effie Berkshire, MD  Additional Notes: Patient tolerated the procedure well. Local anesthetic introduced in an incremental fashion under minimal resistance after negative aspirations. No paresthesias were elicited. After completion of the procedure, no acute issues were identified and patient continued to be monitored by RN.

## 2017-04-29 LAB — BASIC METABOLIC PANEL
ANION GAP: 9 (ref 5–15)
BUN: 11 mg/dL (ref 6–20)
CALCIUM: 9.1 mg/dL (ref 8.9–10.3)
CO2: 26 mmol/L (ref 22–32)
Chloride: 103 mmol/L (ref 101–111)
Creatinine, Ser: 0.87 mg/dL (ref 0.61–1.24)
GFR calc Af Amer: >60 mL/min (ref >60)
Glucose, Bld: 123 mg/dL — ABNORMAL HIGH (ref 65–99)
POTASSIUM: 3.9 mmol/L (ref 3.5–5.1)
Sodium: 138 mmol/L (ref 135–145)

## 2017-04-29 LAB — CBC
HCT: 36.3 % — ABNORMAL LOW (ref 39.0–52.0)
Hemoglobin: 12.2 g/dL — ABNORMAL LOW (ref 13.0–17.0)
MCH: 31.4 pg (ref 26.0–34.0)
MCHC: 33.6 g/dL (ref 30.0–36.0)
MCV: 93.3 fL (ref 78.0–100.0)
PLATELETS: 246 10*3/uL (ref 150–400)
RBC: 3.89 MIL/uL — AB (ref 4.22–5.81)
RDW: 13.6 % (ref 11.5–15.5)
WBC: 17.7 10*3/uL — AB (ref 4.0–10.5)

## 2017-04-29 NOTE — Progress Notes (Signed)
Physical Therapy Treatment Patient Details Name: Nathan Haney MRN: [id number] DOB: 1940-12-03 Today's Date: 04/29/2017    History of Present Illness 77 yo male s/p L TKA 04/28/17. Hx of A fib, ETOH abuse, TIA, meningioma, RBBB    PT Comments    POD # 1 am session Applied KI and instructed on use.  Assisted OOB to amb a greater distance in hallway.  Returned to room then performed some TKR TE's followed by ICE.   Follow Up Recommendations  Follow surgeon's recommendation for DC plan and follow-up therapies     Equipment Recommendations  None recommended by PT    Recommendations for Other Services       Precautions / Restrictions Precautions Precautions: Fall;Knee Precaution Comments: instructed on KI use and proper application  Required Braces or Orthoses: Knee Immobilizer - Left Restrictions Weight Bearing Restrictions: No LLE Weight Bearing: Weight bearing as tolerated    Mobility  Bed Mobility Overal bed mobility: Needs Assistance Bed Mobility: Supine to Sit     Supine to sit: Min assist;HOB elevated     General bed mobility comments: Assist for L LE. Increased time.   Transfers Overall transfer level: Needs assistance Equipment used: Rolling walker (2 wheeled) Transfers: Sit to/from Stand Sit to Stand: Min assist         General transfer comment: Assist to rise, stabilize, control descent. VCs safety, technique, hand/LE placement  Ambulation/Gait Ambulation/Gait assistance: Min guard;Supervision Ambulation Distance (Feet): 75 Feet Assistive device: Rolling walker (2 wheeled) Gait Pattern/deviations: Step-to pattern;Antalgic Gait velocity: decreased   General Gait Details: <25% VC's on proper walker to self distance and safety with turns   Stairs            Wheelchair Mobility    Modified Rankin (Stroke Patients Only)       Balance                                            Cognition                                               Exercises   Total Knee Replacement TE's 10 reps B LE ankle pumps 10 reps towel squeezes 10 reps knee presses 10 reps heel slides   Followed by ICE     General Comments        Pertinent Vitals/Pain Pain Assessment: 0-10 Pain Score: 5  Pain Location: L knee Pain Descriptors / Indicators: Aching;Sore;Operative site guarding Pain Intervention(s): Monitored during session;Premedicated before session;Repositioned;Ice applied    Home Living                      Prior Function            PT Goals (current goals can now be found in the care plan section) Progress towards PT goals: Progressing toward goals    Frequency    7X/week      PT Plan Current plan remains appropriate    Co-evaluation              AM-PAC PT "6 Clicks" Daily Activity  Outcome Measure  Difficulty turning over in bed (including adjusting bedclothes, sheets and blankets)?: A Lot Difficulty moving from lying on back to sitting  on the side of the bed? : Unable Difficulty sitting down on and standing up from a chair with arms (e.g., wheelchair, bedside commode, etc,.)?: Unable Help needed moving to and from a bed to chair (including a wheelchair)?: A Little Help needed walking in hospital room?: A Little Help needed climbing 3-5 steps with a railing? : A Little 6 Click Score: 13    End of Session Equipment Utilized During Treatment: Gait belt;Left knee immobilizer Activity Tolerance: Patient tolerated treatment well Patient left: in chair;with call bell/phone within reach;with family/visitor present Nurse Communication: Mobility status PT Visit Diagnosis: Muscle weakness (generalized) (M62.81);Difficulty in walking, not elsewhere classified (R26.2);Pain Pain - Right/Left: Left Pain - part of body: Knee     Time: 8403-7543 PT Time Calculation (min) (ACUTE ONLY): 25 min  Charges:  $Gait Training: 8-22 mins $Therapeutic Exercise: 8-22 mins                     G Codes:       Rica Koyanagi  PTA [facility]  Acute  Rehab Pager      [phone]

## 2017-04-29 NOTE — Progress Notes (Signed)
Subjective: 1 Day Post-Op Procedure(s) (LRB): LEFT TOTAL KNEE ARTHROPLASTY (Left) Patient reports pain as 4 on 0-10 scale.   No DVT.  Objective: Vital signs in last 24 hours: Temp:  [96.4 F (35.8 C)-99.3 F (37.4 C)] 97.9 F (36.6 C) (03/28 0540) Pulse Rate:  [70-85] 74 (03/28 0540) Resp:  [13-22] 16 (03/28 0540) BP: (115-191)/(67-97) 160/72 (03/28 0540) SpO2:  [96 %-100 %] 96 % (03/28 0540)  Intake/Output from previous day: 03/27 0701 - 03/28 0700 In: 2772.8 [P.O.:572; I.V.:1735.8; IV Piggyback:465] Out: 2000 [Urine:1750; Blood:250] Intake/Output this shift: No intake/output data recorded.  Recent Labs    04/29/17 0537  HGB 12.2*   Recent Labs    04/29/17 0537  WBC 17.7*  RBC 3.89*  HCT 36.3*  PLT 246   Recent Labs    04/29/17 0537  NA 138  K 3.9  CL 103  CO2 26  BUN 11  CREATININE 0.87  GLUCOSE 123*  CALCIUM 9.1   Recent Labs    04/28/17 0558  INR 0.98    Neurologically intact Neurovascular intact Sensation intact distally Intact pulses distally Dorsiflexion/Plantar flexion intact Incision: dressing C/D/I No DVT Good capillary refill  Assessment/Plan: 1 Day Post-Op Procedure(s) (LRB): LEFT TOTAL KNEE ARTHROPLASTY (Left) Advance diet Up with therapy D/C IV fluids Plan for discharge tomorrow  Nathan Haney C 04/29/2017, 7:29 AM

## 2017-04-29 NOTE — Progress Notes (Signed)
Consult-SNF  Patient is going Home/Outpatient   CSW signing off.   Kathrin Greathouse, Latanya Presser, MSW Clinical Social Worker  [phone] 04/29/2017  9:59 AM

## 2017-04-29 NOTE — Progress Notes (Signed)
Physical Therapy Treatment Patient Details Name: Nathan Haney MRN: [id number] DOB: Jun 26, 1940 Today's Date: 04/29/2017    History of Present Illness 77 yo male s/p L TKA 04/28/17. Hx of A fib, ETOH abuse, TIA, meningioma, RBBB    PT Comments    POD # 1 pm session Assisted with amb a greater distance then back to bed for a few more TE's followed by ICE.   Follow Up Recommendations  Follow surgeon's recommendation for DC plan and follow-up therapies;Outpatient PT     Equipment Recommendations  None recommended by PT    Recommendations for Other Services       Precautions / Restrictions Precautions Precautions: Fall;Knee Precaution Comments: instructed on KI use and proper application  Required Braces or Orthoses: Knee Immobilizer - Left Restrictions Weight Bearing Restrictions: No LLE Weight Bearing: Weight bearing as tolerated    Mobility  Bed Mobility Overal bed mobility: Needs Assistance Bed Mobility: Supine to Sit     Supine to sit: Min assist;HOB elevated     General bed mobility comments: Assist for L LE. Increased time.   Transfers Overall transfer level: Needs assistance Equipment used: Rolling walker (2 wheeled) Transfers: Sit to/from Stand Sit to Stand: Min assist         General transfer comment: Assist to rise, stabilize, control descent. VCs safety, technique, hand/LE placement  Ambulation/Gait Ambulation/Gait assistance: Min guard;Supervision Ambulation Distance (Feet): 75 Feet Assistive device: Rolling walker (2 wheeled) Gait Pattern/deviations: Step-to pattern;Antalgic Gait velocity: decreased   General Gait Details: <25% VC's on proper walker to self distance and safety with turns   Stairs            Wheelchair Mobility    Modified Rankin (Stroke Patients Only)       Balance                                            Cognition                                               Exercises  10 reps L LE SLR  10 reps L LE HS AAROM    General Comments        Pertinent Vitals/Pain Pain Assessment: 0-10 Pain Score: 5  Pain Location: L knee Pain Descriptors / Indicators: Aching;Sore;Operative site guarding Pain Intervention(s): Monitored during session;Premedicated before session;Repositioned;Ice applied    Home Living                      Prior Function            PT Goals (current goals can now be found in the care plan section) Progress towards PT goals: Progressing toward goals    Frequency    7X/week      PT Plan Current plan remains appropriate    Co-evaluation              AM-PAC PT "6 Clicks" Daily Activity  Outcome Measure  Difficulty turning over in bed (including adjusting bedclothes, sheets and blankets)?: A Lot Difficulty moving from lying on back to sitting on the side of the bed? : Unable Difficulty sitting down on and standing up from a chair with arms (e.g., wheelchair, bedside commode, etc,.)?: Unable  Help needed moving to and from a bed to chair (including a wheelchair)?: A Little Help needed walking in hospital room?: A Little Help needed climbing 3-5 steps with a railing? : A Little 6 Click Score: 13    End of Session Equipment Utilized During Treatment: Gait belt;Left knee immobilizer Activity Tolerance: Patient tolerated treatment well Patient left: in chair;with call bell/phone within reach;with family/visitor present Nurse Communication: Mobility status PT Visit Diagnosis: Muscle weakness (generalized) (M62.81);Difficulty in walking, not elsewhere classified (R26.2);Pain Pain - Right/Left: Left Pain - part of body: Knee     Time: 1314-1330 PT Time Calculation (min) (ACUTE ONLY): 16 min  Charges:  $Gait Training: 8-22 mins                   G Codes:       Rica Koyanagi  PTA [facility]  Acute  Rehab Pager      [phone]

## 2017-04-30 LAB — CBC
HEMATOCRIT: 35.2 % — AB (ref 39.0–52.0)
HEMOGLOBIN: 11.6 g/dL — AB (ref 13.0–17.0)
MCH: 31.3 pg (ref 26.0–34.0)
MCHC: 33 g/dL (ref 30.0–36.0)
MCV: 94.9 fL (ref 78.0–100.0)
Platelets: 214 10*3/uL (ref 150–400)
RBC: 3.71 MIL/uL — ABNORMAL LOW (ref 4.22–5.81)
RDW: 13.8 % (ref 11.5–15.5)
WBC: 12.7 10*3/uL — AB (ref 4.0–10.5)

## 2017-04-30 MED ORDER — OXYCODONE HCL 5 MG PO TABS: 5.0000 mg | ORAL_TABLET | ORAL | 0 refills | Status: DC | PRN

## 2017-04-30 MED ORDER — GABAPENTIN 300 MG PO CAPS: 300.0000 mg | ORAL_CAPSULE | Freq: Three times a day (TID) | ORAL | 1 refills | Status: DC

## 2017-04-30 MED ORDER — POLYETHYLENE GLYCOL 3350 17 G PO PACK: 17.0000 g | PACK | Freq: Every day | ORAL | 0 refills | Status: DC | PRN

## 2017-04-30 MED ORDER — DOCUSATE SODIUM 100 MG PO CAPS: 100.0000 mg | ORAL_CAPSULE | Freq: Two times a day (BID) | ORAL | 1 refills | Status: DC

## 2017-04-30 NOTE — Progress Notes (Signed)
Patient ID: Nathan Haney, male   DOB: [birth year], 77 y.o.   MRN: [id number] Subjective: 2 Days Post-Op Procedure(s) (LRB): LEFT TOTAL KNEE ARTHROPLASTY (Left) Patient reports pain as moderate.    Patient has complaints of L knee and thigh pain, low back pain which started this AM with shooting pain down left leg intermittently. No N/V. No spasms. Was hoping to go home today.  Objective: Vital signs in last 24 hours: Temp:  [97.8 F (36.6 C)-98.6 F (37 C)] 98.1 F (36.7 C) (03/29 0509) Pulse Rate:  [75-88] 75 (03/29 0509) Resp:  [13-17] 17 (03/29 0509) BP: (129-161)/(55-80) 142/75 (03/29 0509) SpO2:  [97 %-99 %] 98 % (03/29 0509)  Intake/Output from previous day:  Intake/Output Summary (Last 24 hours) at 04/30/2017 0822 Last data filed at 04/30/2017 0509 Gross per 24 hour  Intake 500 ml  Output 1800 ml  Net -1300 ml    Intake/Output this shift: No intake/output data recorded.  Labs: Results for orders placed or performed during the hospital encounter of 04/28/17  APTT  Result Value Ref Range   aPTT 30 24 - 36 seconds  Protime-INR  Result Value Ref Range   Prothrombin Time 12.9 11.4 - 15.2 seconds   INR 0.98   CBC  Result Value Ref Range   WBC 17.7 (H) 4.0 - 10.5 K/uL   RBC 3.89 (L) 4.22 - 5.81 MIL/uL   Hemoglobin 12.2 (L) 13.0 - 17.0 g/dL   HCT 36.3 (L) 39.0 - 52.0 %   MCV 93.3 78.0 - 100.0 fL   MCH 31.4 26.0 - 34.0 pg   MCHC 33.6 30.0 - 36.0 g/dL   RDW 13.6 11.5 - 15.5 %   Platelets 246 150 - 400 K/uL  Basic metabolic panel  Result Value Ref Range   Sodium 138 135 - 145 mmol/L   Potassium 3.9 3.5 - 5.1 mmol/L   Chloride 103 101 - 111 mmol/L   CO2 26 22 - 32 mmol/L   Glucose, Bld 123 (H) 65 - 99 mg/dL   BUN 11 6 - 20 mg/dL   Creatinine, Ser 0.87 0.61 - 1.24 mg/dL   Calcium 9.1 8.9 - 10.3 mg/dL   GFR calc non Af Amer >60 >60 mL/min   GFR calc Af Amer >60 >60 mL/min   Anion gap 9 5 - 15  CBC  Result Value Ref Range   WBC 12.7 (H) 4.0 - 10.5 K/uL   RBC 3.71  (L) 4.22 - 5.81 MIL/uL   Hemoglobin 11.6 (L) 13.0 - 17.0 g/dL   HCT 35.2 (L) 39.0 - 52.0 %   MCV 94.9 78.0 - 100.0 fL   MCH 31.3 26.0 - 34.0 pg   MCHC 33.0 30.0 - 36.0 g/dL   RDW 13.8 11.5 - 15.5 %   Platelets 214 150 - 400 K/uL    Exam - Neurologically intact ABD soft Neurovascular intact Sensation intact distally Intact pulses distally Dorsiflexion/Plantar flexion intact Incision: dressing C/D/I and no drainage No cellulitis present Compartment soft no sign of DVT Dressing/Incision - clean, dry, no drainage Motor function intact - moving foot and toes well on exam.   Assessment/Plan: 2 Days Post-Op Procedure(s) (LRB): LEFT TOTAL KNEE ARTHROPLASTY (Left)  Advance diet Up with therapy D/C IV fluids Past Medical History:  Diagnosis Date  . Arthritis    left knee  . Coronary atherosclerosis of native coronary artery   . GERD (gastroesophageal reflux disease)   . H/O brain tumor   . H/O ETOH abuse   .  HLD (hyperlipidemia)   . Hx of migraines   . Hypertension   . Incomplete right bundle branch block    new as of 02-2017 per LOV with [name], NP on chart   . Lumbar stenosis with neurogenic claudication    hx of   . Meningioma ([place])   . Neurologic cardiac syncope 1999   positive tilt test ; treated; [place], [place]; at pre-op appt on 04-19-17 patient reports last episode was 04-09-2017 , was siting watching tv  and stood up and took 8 steps into the kitchen and felt dizzy , grabbed a chair and colapsed. deneis presyncope sx , but states " i can feel my muscles just stop working ". denies hitting head with fall , only sustained bruise to right arm from fall.   . Paresthesia of left lower extremity   . Paroxysmal A-fib [facility])    s/p ablation   . Sleep apnea    on cpap  . Stenosis, cervical spine    h/o   . TIA (transient ischemic attack)    see ED encounter 12-11-16 in epic care everywhere ' per patient on 04-19-17 " they never were sure i had one , i just had  symptoms" report CT of his head was fine     DVT Prophylaxis - Xarelto  Protocol- on 10mg  QD here, will resume 20mg  QD at home upon D/C, has at home Weight-Bearing as tolerated to L leg Has gabapentin for nerve pain, should help with what sounds like sciatic flare Will discuss with Dr Tonita Cong Possible D/C today otherwise plan for tomorrow Scheduled for outpt PT monday  BISSELL, JACLYN M. 04/30/2017, 8:22 AM

## 2017-04-30 NOTE — Progress Notes (Signed)
Physical Therapy Treatment Patient Details Name: Nathan Haney MRN: [id number] DOB: 1940-02-13 Today's Date: 04/30/2017    History of Present Illness 77 yo male s/p L TKA 04/28/17. Hx of A fib, ETOH abuse, TIA, meningioma, RBBB    PT Comments    Progressing with mobility. Pt reported 8/10 knee pain. He reported minimal back/sciatica pain during session. Reviewed/practiced exercises, gait training, and stair training. Issued HEP for pt to perform 2x/day until he begins OP PT. Okay to d/c from PT standpoint-made RN aware.     Follow Up Recommendations  Follow surgeon's recommendation for DC plan and follow-up therapies     Equipment Recommendations  None recommended by PT    Recommendations for Other Services       Precautions / Restrictions Precautions Precautions: Fall;Knee Precaution Comments: instructed on KI use and proper application  Required Braces or Orthoses: Knee Immobilizer - Left Restrictions Weight Bearing Restrictions: No LLE Weight Bearing: Weight bearing as tolerated    Mobility  Bed Mobility Overal bed mobility: Needs Assistance Bed Mobility: Supine to Sit     Supine to sit: Min guard;HOB elevated     General bed mobility comments: close guard for safety.   Transfers Overall transfer level: Needs assistance Equipment used: Rolling walker (2 wheeled) Transfers: Sit to/from Stand Sit to Stand: Min guard         General transfer comment: close guard for saety. VCs hand placement.   Ambulation/Gait Ambulation/Gait assistance: Min guard Ambulation Distance (Feet): 75 Feet Assistive device: Rolling walker (2 wheeled) Gait Pattern/deviations: Step-to pattern     General Gait Details: VCs safety.    Stairs Stairs: Yes Min guard Assist Stair Management: One rail Left;Step to pattern;Forwards Number of Stairs: 2 General stair comments: up and over portable steps. VCs safety, technique, sequence. close guard for safety.  Wheelchair Mobility     Modified Rankin (Stroke Patients Only)       Balance                                            Cognition Arousal/Alertness: Awake/alert Behavior During Therapy: WFL for tasks assessed/performed Overall Cognitive Status: Within Functional Limits for tasks assessed                                        Exercises Total Joint Exercises Ankle Circles/Pumps: AROM;Both;10 reps;Supine Quad Sets: AROM;Both;10 reps;Supine Heel Slides: AAROM;Left;10 reps;Supine Hip ABduction/ADduction: AAROM;Left;10 reps;Supine Straight Leg Raises: (deferred today due to LBP/sciatica issues) Goniometric ROM: ~10-60 degrees    General Comments        Pertinent Vitals/Pain Pain Assessment: 0-10 Pain Score: 8  Pain Location: L knee Pain Descriptors / Indicators: Aching;Sore;Operative site guarding Pain Intervention(s): Monitored during session    Home Living                      Prior Function            PT Goals (current goals can now be found in the care plan section) Progress towards PT goals: Progressing toward goals    Frequency    7X/week      PT Plan Current plan remains appropriate    Co-evaluation              AM-PAC PT "  6 Clicks" Daily Activity  Outcome Measure  Difficulty turning over in bed (including adjusting bedclothes, sheets and blankets)?: A Little Difficulty moving from lying on back to sitting on the side of the bed? : A Little Difficulty sitting down on and standing up from a chair with arms (e.g., wheelchair, bedside commode, etc,.)?: A Little Help needed moving to and from a bed to chair (including a wheelchair)?: A Little Help needed walking in hospital room?: A Little Help needed climbing 3-5 steps with a railing? : A Little 6 Click Score: 18    End of Session Equipment Utilized During Treatment: Gait belt;Left knee immobilizer Activity Tolerance: Patient tolerated treatment well Patient left: in  chair;with call bell/phone within reach   PT Visit Diagnosis: Muscle weakness (generalized) (M62.81);Difficulty in walking, not elsewhere classified (R26.2);Pain Pain - Right/Left: Left Pain - part of body: Knee     Time: 0912-0936 PT Time Calculation (min) (ACUTE ONLY): 24 min  Charges:  $Gait Training: 8-22 mins $Therapeutic Exercise: 8-22 mins                    G Codes:        Weston Anna, MPT Pager: [phone]

## 2017-04-30 NOTE — Plan of Care (Signed)
Plan of care discussed.   

## 2017-05-03 NOTE — Discharge Summary (Signed)
Physician Discharge Summary   Patient ID: Nathan Haney MRN: [id number] DOB/AGE: [birth year] 77 y.o.  Admit date: 04/28/2017 Discharge date: 04/30/2017  Primary Diagnosis: left knee primary osteoarthritis  Admission Diagnoses:  Past Medical History:  Diagnosis Date  . Arthritis    left knee  . Coronary atherosclerosis of native coronary artery   . GERD (gastroesophageal reflux disease)   . H/O brain tumor   . H/O ETOH abuse   . HLD (hyperlipidemia)   . Hx of migraines   . Hypertension   . Incomplete right bundle branch block    new as of 02-2017 per LOV with [name], NP on chart   . Lumbar stenosis with neurogenic claudication    hx of   . Meningioma ([place])   . Neurologic cardiac syncope 1999   positive tilt test ; treated; [place], [place]; at pre-op appt on 04-19-17 patient reports last episode was 04-09-2017 , was siting watching tv  and stood up and took 8 steps into the kitchen and felt dizzy , grabbed a chair and colapsed. deneis presyncope sx , but states " i can feel my muscles just stop working ". denies hitting head with fall , only sustained bruise to right arm from fall.   . Paresthesia of left lower extremity   . Paroxysmal A-fib [facility])    s/p ablation   . Sleep apnea    on cpap  . Stenosis, cervical spine    h/o   . TIA (transient ischemic attack)    see ED encounter 12-11-16 in epic care everywhere ' per patient on 04-19-17 " they never were sure i had one , i just had symptoms" report CT of his head was fine    Discharge Diagnoses:   Principal Problem:   Primary osteoarthritis of left knee Active Problems:   Left knee DJD  Estimated body mass index is 31.57 kg/m as calculated from the following:   Height as of this encounter: 5' 10" (1.778 m).   Weight as of this encounter: 99.8 kg (220 lb).  Procedure:  Procedure(s) (LRB): LEFT TOTAL KNEE ARTHROPLASTY (Left)   Consults: None  HPI: see H&P Laboratory Data: Admission on 04/28/2017, Discharged  on 04/30/2017  Component Date Value Ref Range Status  . aPTT 04/28/2017 30  24 - 36 seconds Final   Performed at [facility], [place] [address], [place], [place] [address]  . Prothrombin Time 04/28/2017 12.9  11.4 - 15.2 seconds Final  . INR 04/28/2017 0.98   Final   Performed at [facility], [place] [address], [place], [place] [address]  . WBC 04/29/2017 17.7* 4.0 - 10.5 K/uL Final  . RBC 04/29/2017 3.89* 4.22 - 5.81 MIL/uL Final  . Hemoglobin 04/29/2017 12.2* 13.0 - 17.0 g/dL Final  . HCT 04/29/2017 36.3* 39.0 - 52.0 % Final  . MCV 04/29/2017 93.3  78.0 - 100.0 fL Final  . MCH 04/29/2017 31.4  26.0 - 34.0 pg Final  . MCHC 04/29/2017 33.6  30.0 - 36.0 g/dL Final  . RDW 04/29/2017 13.6  11.5 - 15.5 % Final  . Platelets 04/29/2017 246  150 - 400 K/uL Final   Performed at [facility], [place] [address] [place], [place] [address]  . Sodium 04/29/2017 138  135 - 145 mmol/L Final  . Potassium 04/29/2017 3.9  3.5 - 5.1 mmol/L Final  . Chloride 04/29/2017 103  101 - 111 mmol/L Final  . CO2 04/29/2017 26  22 - 32  mmol/L Final  . Glucose, Bld 04/29/2017 123* 65 - 99 mg/dL Final  . BUN 04/29/2017 11  6 - 20 mg/dL Final  . Creatinine, Ser 04/29/2017 0.87  0.61 - 1.24 mg/dL Final  . Calcium 04/29/2017 9.1  8.9 - 10.3 mg/dL Final  . GFR calc non Af Amer 04/29/2017 >60  >60 mL/min Final  . GFR calc Af Amer 04/29/2017 >60  >60 mL/min Final   Comment: (NOTE) The eGFR has been calculated using the CKD EPI equation. This calculation has not been validated in all clinical situations. eGFR's persistently <60 mL/min signify possible Chronic Kidney Disease.   Georgiann Hahn gap 04/29/2017 9  5 - 15 Final   Performed at [facility], [place] [address], [place], [place] [address]  . WBC 04/30/2017 12.7* 4.0 - 10.5 K/uL Final  . RBC 04/30/2017 3.71* 4.22 - 5.81 MIL/uL Final  . Hemoglobin 04/30/2017 11.6* 13.0 - 17.0 g/dL Final  . HCT  04/30/2017 35.2* 39.0 - 52.0 % Final  . MCV 04/30/2017 94.9  78.0 - 100.0 fL Final  . MCH 04/30/2017 31.3  26.0 - 34.0 pg Final  . MCHC 04/30/2017 33.0  30.0 - 36.0 g/dL Final  . RDW 04/30/2017 13.8  11.5 - 15.5 % Final  . Platelets 04/30/2017 214  150 - 400 K/uL Final   Performed at [facility], [place] [address], [place], [place] [address]  Hospital Outpatient Visit on 04/19/2017  Component Date Value Ref Range Status  . MRSA, PCR 04/19/2017 NEGATIVE  NEGATIVE Final  . Staphylococcus aureus 04/19/2017 NEGATIVE  NEGATIVE Final   Comment: (NOTE) The Xpert SA Assay (FDA approved for NASAL specimens in patients 69 years of age and older), is one component of a comprehensive surveillance program. It is not intended to diagnose infection nor to guide or monitor treatment. Performed at [facility], [place] [address] [place], [place] [address]   . aPTT 04/19/2017 40* 24 - 36 seconds Final   Comment:        IF BASELINE aPTT IS ELEVATED, SUGGEST PATIENT RISK ASSESSMENT BE USED TO DETERMINE APPROPRIATE ANTICOAGULANT THERAPY. Performed at [facility], [place] [address], [place], [place] [address]   . Sodium 04/19/2017 141  135 - 145 mmol/L Final  . Potassium 04/19/2017 4.3  3.5 - 5.1 mmol/L Final  . Chloride 04/19/2017 103  101 - 111 mmol/L Final  . CO2 04/19/2017 28  22 - 32 mmol/L Final  . Glucose, Bld 04/19/2017 96  65 - 99 mg/dL Final  . BUN 04/19/2017 10  6 - 20 mg/dL Final  . Creatinine, Ser 04/19/2017 0.98  0.61 - 1.24 mg/dL Final  . Calcium 04/19/2017 9.8  8.9 - 10.3 mg/dL Final  . GFR calc non Af Amer 04/19/2017 >60  >60 mL/min Final  . GFR calc Af Amer 04/19/2017 >60  >60 mL/min Final   Comment: (NOTE) The eGFR has been calculated using the CKD EPI equation. This calculation has not been validated in all clinical situations. eGFR's persistently <60 mL/min signify possible Chronic Kidney Disease.   Georgiann Hahn gap 04/19/2017 10   5 - 15 Final   Performed at [facility], [place] [address], [place], [place] [address]  . WBC 04/19/2017 7.6  4.0 - 10.5 K/uL Final  . RBC 04/19/2017 4.61  4.22 - 5.81 MIL/uL Final  . Hemoglobin 04/19/2017 14.5  13.0 - 17.0 g/dL Final  . HCT 04/19/2017 43.7  39.0 - 52.0 % Final  .  MCV 04/19/2017 94.8  78.0 - 100.0 fL Final  . MCH 04/19/2017 31.5  26.0 - 34.0 pg Final  . MCHC 04/19/2017 33.2  30.0 - 36.0 g/dL Final  . RDW 04/19/2017 13.8  11.5 - 15.5 % Final  . Platelets 04/19/2017 243  150 - 400 K/uL Final   Performed at [facility], [place] [address] [place], [place] [address]  . Prothrombin Time 04/19/2017 20.1* 11.4 - 15.2 seconds Final  . INR 04/19/2017 1.74   Final   Performed at [facility], [place] [address], [place], [place] [address]  . Color, Urine 04/19/2017 YELLOW  YELLOW Final  . APPearance 04/19/2017 CLEAR  CLEAR Final  . Specific Gravity, Urine 04/19/2017 1.008  1.005 - 1.030 Final  . pH 04/19/2017 6.0  5.0 - 8.0 Final  . Glucose, UA 04/19/2017 NEGATIVE  NEGATIVE mg/dL Final  . Hgb urine dipstick 04/19/2017 SMALL* NEGATIVE Final  . Bilirubin Urine 04/19/2017 NEGATIVE  NEGATIVE Final  . Ketones, ur 04/19/2017 NEGATIVE  NEGATIVE mg/dL Final  . Protein, ur 04/19/2017 NEGATIVE  NEGATIVE mg/dL Final  . Nitrite 04/19/2017 NEGATIVE  NEGATIVE Final  . Leukocytes, UA 04/19/2017 NEGATIVE  NEGATIVE Final  . RBC / HPF 04/19/2017 0-5  0 - 5 RBC/hpf Final  . WBC, UA 04/19/2017 0-5  0 - 5 WBC/hpf Final  . Bacteria, UA 04/19/2017 NONE SEEN  NONE SEEN Final  . Squamous Epithelial / LPF 04/19/2017 NONE SEEN  NONE SEEN Final   Performed at [facility], [place] [address] [place], [place] [address]     X-Rays:Dg Knee Left Port  Result Date: 04/28/2017 CLINICAL DATA:  Status post left total knee arthroplasty. EXAM: PORTABLE LEFT KNEE - 1-2 VIEW COMPARISON:  None. FINDINGS: The femoral and tibial components appear to be  well situated. No fracture or dislocation is noted. Expected postoperative changes are noted in the soft tissues anteriorly. Vascular calcifications are noted. IMPRESSION: Status post left total knee arthroplasty. Electronically Signed   By: Marijo Conception, M.D.   On: 04/28/2017 10:27    EKG:No orders found for this or any previous visit.   Hospital Course: HEATHER STREEPER is a 77 y.o. who was admitted to [facility]. They were brought to the operating room on 04/28/2017 and underwent Procedure(s): LEFT TOTAL KNEE ARTHROPLASTY.  Patient tolerated the procedure well and was later transferred to the recovery room and then to the orthopaedic floor for postoperative care.  They were given PO and IV analgesics for pain control following their surgery.  They were given 24 hours of postoperative antibiotics of  Anti-infectives (From admission, onward)   Start     Dose/Rate Route Frequency Ordered Stop   04/28/17 1400  ceFAZolin (ANCEF) IVPB 2g/100 mL premix     2 g 200 mL/hr over 30 Minutes Intravenous Every 6 hours 04/28/17 1237 04/29/17 0245   04/28/17 0823  polymyxin B 500,000 Units, bacitracin 50,000 Units in sodium chloride 0.9 % 500 mL irrigation  Status:  Discontinued       As needed 04/28/17 0824 04/28/17 0952   04/28/17 0600  ceFAZolin (ANCEF) IVPB 2g/100 mL premix     2 g 200 mL/hr over 30 Minutes Intravenous On call to O.R. 04/28/17 7622 04/28/17 0745     and started on DVT prophylaxis in the form of Xarelto, TED hose and SCDs.   PT and OT were ordered for total joint protocol.  Discharge planning consulted to help with postop disposition  and equipment needs.  Patient had a good night on the evening of surgery.  They started to get up OOB with therapy on day one.   Continued to work with therapy into day two.  By day two, the patient had progressed with therapy and meeting their goals.  Incision was healing well.  Patient was seen in rounds and was ready to go home.   Diet: Regular  diet Activity:WBAT Follow-up:in 10-14 days Disposition - Home with outpatient PT scheduled for 4/1 at [facility] Discharged Condition: good   Discharge Instructions    Call MD / Call 911   Complete by:  As directed    If you experience chest pain or shortness of breath, CALL 911 and be transported to the hospital emergency room.  If you develope a fever above 101 F, pus (white drainage) or increased drainage or redness at the wound, or calf pain, call your surgeon's office.   Constipation Prevention   Complete by:  As directed    Drink plenty of fluids.  Prune juice may be helpful.  You may use a stool softener, such as Colace (over the counter) 100 mg twice a day.  Use MiraLax (over the counter) for constipation as needed.   Diet - low sodium heart healthy   Complete by:  As directed    Increase activity slowly as tolerated   Complete by:  As directed      Allergies as of 04/30/2017      Reactions   Eggs Or Egg-derived Products Swelling, Other (See Comments)   egg whites only!  Causes GI discomfort and excess phlegm    Carvedilol Diarrhea   Cheese    Cheddar cheese; Upset stomach    Hydralazine Other (See Comments)   Abdominal pain      Medication List    STOP taking these medications   Coenzyme Q10 200 MG capsule   Fish Oil 1200 MG Caps   naproxen sodium 220 MG tablet Commonly known as:  ALEVE     TAKE these medications   cimetidine 200 MG tablet Commonly known as:  TAGAMET Take 200 mg by mouth 2 (two) times daily as needed (shingles flare).   diltiazem 180 MG 24 hr capsule Commonly known as:  CARDIZEM CD Take 180 mg by mouth at bedtime.   diphenhydrAMINE 25 MG tablet Commonly known as:  BENADRYL Take 25 mg by mouth daily as needed (migraines).   docusate sodium 100 MG capsule Commonly known as:  COLACE Take 1 capsule (100 mg total) by mouth 2 (two) times daily.   EPIPEN 2-PAK 0.3 mg/0.3 mL Soaj injection Generic drug:  EPINEPHrine Inject 0.3 mg  into the muscle daily as needed (allergic reaction).   famotidine 20 MG tablet Commonly known as:  PEPCID Take 20 mg by mouth daily as needed for heartburn or indigestion.   fexofenadine 180 MG tablet Commonly known as:  ALLEGRA Take 180 mg by mouth daily.   gabapentin 300 MG capsule Commonly known as:  NEURONTIN Take 1 capsule (300 mg total) by mouth 3 (three) times daily.   metoprolol succinate 100 MG 24 hr tablet Commonly known as:  TOPROL-XL Take 100 mg by mouth at bedtime. Take with or immediately following a meal.   multivitamin with minerals Tabs tablet Take 1 tablet by mouth daily.   nitroGLYCERIN 0.4 MG SL tablet Commonly known as:  NITROSTAT Place 0.4 mg under the tongue every 5 (five) minutes as needed for chest pain.   oxyCODONE  5 MG immediate release tablet Commonly known as:  Oxy IR/ROXICODONE Take 1-2 tablets (5-10 mg total) by mouth every 4 (four) hours as needed for moderate pain (pain score 4-6).   polyethylene glycol packet Commonly known as:  MIRALAX / GLYCOLAX Take 17 g by mouth daily as needed for mild constipation.   psyllium 0.52 g capsule Commonly known as:  REGULOID Take 0.52 g by mouth 2 (two) times daily.   REFRESH OPTIVE OP Place 1 drop into both eyes 2 (two) times daily as needed (dry eyes).   rivaroxaban 20 MG Tabs tablet Commonly known as:  XARELTO Take 20 mg by mouth at bedtime.   rosuvastatin 10 MG tablet Commonly known as:  CRESTOR Take 10 mg by mouth at bedtime.   triamcinolone ointment 0.1 % Commonly known as:  KENALOG Apply 1 application topically daily as needed (rash).   valsartan 160 MG tablet Commonly known as:  DIOVAN Take 160 mg by mouth daily.      Follow-up Information    Susa Day, MD Follow up in 2 week(s).   Specialty:  Orthopedic Surgery Contact information: [address] [place] [address] [phone]           Signed: Lacie Draft, PA-C Orthopaedic Surgery 05/03/2017,  11:28 AM

## 2017-12-06 ENCOUNTER — Ambulatory Visit (INDEPENDENT_AMBULATORY_CARE_PROVIDER_SITE_OTHER): Payer: Medicare Other | Admitting: Nurse Practitioner

## 2017-12-06 ENCOUNTER — Telehealth: Payer: Self-pay

## 2017-12-06 ENCOUNTER — Other Ambulatory Visit (INDEPENDENT_AMBULATORY_CARE_PROVIDER_SITE_OTHER): Payer: Medicare Other

## 2017-12-06 ENCOUNTER — Encounter: Payer: Self-pay | Admitting: Nurse Practitioner

## 2017-12-06 VITALS — BP 136/74 | HR 88 | Resp 18 | Ht 70.0 in | Wt 218.0 lb

## 2017-12-06 DIAGNOSIS — K625 Hemorrhage of anus and rectum: Secondary | ICD-10-CM | POA: Diagnosis not present

## 2017-12-06 DIAGNOSIS — R748 Abnormal levels of other serum enzymes: Secondary | ICD-10-CM

## 2017-12-06 DIAGNOSIS — Z7901 Long term (current) use of anticoagulants: Secondary | ICD-10-CM

## 2017-12-06 LAB — CBC
HCT: 44.2 % (ref 39.0–52.0)
Hemoglobin: 15.3 g/dL (ref 13.0–17.0)
MCHC: 34.6 g/dL (ref 30.0–36.0)
MCV: 92.4 fl (ref 78.0–100.0)
Platelets: 226 10*3/uL (ref 150.0–400.0)
RBC: 4.78 Mil/uL (ref 4.22–5.81)
RDW: 14.2 % (ref 11.5–15.5)
WBC: 9.9 10*3/uL (ref 4.0–10.5)

## 2017-12-06 MED ORDER — NA SULFATE-K SULFATE-MG SULF 17.5-3.13-1.6 GM/177ML PO SOLN: ORAL | 0 refills | Status: DC

## 2017-12-06 NOTE — Telephone Encounter (Signed)
   [facility] [address] [place], [place]  [address] Phone:  [phone]   Fax:  [phone]  12/06/2017   RE:      Nathan Haney DOB:   06-06-1940 MRN:   [id number]   Dear Dr. Omelia Blackwater,    We have scheduled the above patient for an endoscopic procedure. Our records show that he is on anticoagulation therapy.   Please advise as to whether the patient may come off his therapy of Xarelto 2 days prior to the colonoscopy procedure, which is scheduled for 01/07/18.  Please fax back to [place], [place] at [phone].   Sincerely,    Thurmon Fair. RMA

## 2017-12-06 NOTE — Progress Notes (Signed)
ASSESSMENT    1. 77 yo male with 1.5 years of painless, low volume rectal bleeding. He is on Xarelto. Probably hemorrhoidal but need to exclude other etiologies large polyps/neoplasm.   2. AFIB -CHA2DS2-VaSC score: 3 (HTN, AGE, CAD). He is s/p ablation x 2, last one in April 2018. On Xarelto. Followed at [facility]  3. Mild normocytic anemia based on labs late March of this year. Baseline hgb 12.3 to 14.5 Last CBC > 2 months ago.  4. Mildly elevated transaminases (AST 66 / ALT 65).  Normal at times. Fatty liver on list of DDx  PLAN:    -Patient should undergo colonoscopy given the rectal bleeding.  His last colonoscopy was 2003 with TI intubation. Internal hemorrhoids found.   If bleeding proves to be hemorrhoidal then consider banding at some point. The risks and benefits of colonoscopy with possible polypectomy were discussed and the patient agrees to proceed.   -Hold Xarelto for 2 days before procedure - will instruct when and how to resume after procedure. Patient understands that there is a low but real risk of cardiovascular event such as heart attack, stroke, or embolism /  thrombosis while off blood thinner. The patient consents to proceed. Will communicate by phone or EMR with patient's prescribing provider to confirm that holding Xarelto is reasonable in this case.  -Patient sees his cardiologist next Monday.  There is a question of whether he will be scheduled for repeat ablation.  Patient will call me following that visit and if ablation is planned then we will probably need to postpone colonoscopy  -Repeat CBC today  -Recommend PCP monitor liver tests. Would repeat liver tests around Feb (six months from last liver tests) .  If transaminases are still elevated (even if to mild degree) then would obtain u/s. If liver studies persistently elevated then happy to see back for further evaluation   HPI:    Patient is a 77 year old male new to this practice.  He is  referred by PCP, Dr. Alean Rinne for colonoscopy. His his wife is a patient here. Patient has PMH of Afib, on Xarelto.  He has a hx of a TIA, chronic ischemic heart disease.  No chest pain, shortness of breath, orthopnea or dizziness  Chief Complaint:   Rectal bleeding Patient gives a 1.5-year history of painless, low volume rectal bleeding.  He denies constipation, diarrhea or other bowel changes.  Weight stable, actually increasing.  He has no abdominal pain, nausea vomiting or other concerning symptoms.   Data Reviewed:   Labs 09/21/17  hgb 14.5  Alk phos 114 AST 66 ALT 65  Labs 04/30/2017 Hemoglobin 11.6, MCV 94.7 Normal renal function   Past Medical History:  Diagnosis Date  . Arthritis    left knee  . Coronary atherosclerosis of native coronary artery   . GERD (gastroesophageal reflux disease)   . H/O brain tumor   . H/O ETOH abuse   . HLD (hyperlipidemia)   . Hx of migraines   . Hypertension   . Incomplete right bundle branch block    new as of 02-2017 per LOV with [name], NP on chart   . Lumbar stenosis with neurogenic claudication    hx of   . Meningioma ([place])   . Neurologic cardiac syncope 1999   positive tilt test ; treated; [place], [place]; at pre-op appt on 04-19-17 patient reports last episode was 04-09-2017 ,  was siting watching tv  and stood up and took 8 steps into the kitchen and felt dizzy , grabbed a chair and colapsed. deneis presyncope sx , but states " i can feel my muscles just stop working ". denies hitting head with fall , only sustained bruise to right arm from fall.   . Paresthesia of left lower extremity   . Paroxysmal A-fib [facility])    s/p ablation   . Sleep apnea    on cpap  . Stenosis, cervical spine    h/o   . TIA (transient ischemic attack)    see ED encounter 12-11-16 in epic care everywhere ' per patient on 04-19-17 " they never were sure i had one , i just had symptoms" report CT of his head was fine      Past Surgical History:   Procedure Laterality Date  . APPENDECTOMY  age 63   . ATRIAL ABLATION SURGERY Left 05/28/2016   Dr Heather Roberts at [place]   . ATRIAL ABLATION SURGERY  08/12/2005   Dr Omelia Blackwater   . BRAIN MENINGIOMA EXCISION  10/2008  . CARDIAC CATHETERIZATION  03/2002  . CERVICAL LAMINECTOMY  09/06/2002   c3-c7  . EYE SURGERY Left 1961   " i was diving for a foul ball and ran into a rail", sustained crush injury to orbital plate left eye ; has  metal in place   . EYE SURGERY     cataract bilateral   . HERNIA REPAIR    . lipoma excision   1981   states surgery was intended for a hernia repair but they found a lipoma instea dand removed it   . TOTAL KNEE ARTHROPLASTY Left 04/28/2017   Procedure: LEFT TOTAL KNEE ARTHROPLASTY;  Surgeon: Susa Day, MD;  Location: [facility];  Service: Orthopedics;  Laterality: Left;  150 mins  . TRIGGER FINGER RELEASE     pinky finger right hand    Family History  Problem Relation Age of Onset  . Atrial fibrillation Mother   . Liver disease Mother   . Cirrhosis Mother   . Hypertension Sister   . Glaucoma Sister   . Heart attack Brother 73       "older brother"  . Hypertension Sister    Social History   Tobacco Use  . Smoking status: Never Smoker  . Smokeless tobacco: Never Used  Substance Use Topics  . Alcohol use: Yes    Alcohol/week: 14.0 standard drinks    Types: 14 Cans of beer per week    Comment: 20 beers a week  . Drug use: No   Current Outpatient Medications  Medication Sig Dispense Refill  . Carboxymethylcellul-Glycerin (REFRESH OPTIVE OP) Place 1 drop into both eyes 2 (two) times daily as needed (dry eyes).    . cimetidine (TAGAMET) 200 MG tablet Take 200 mg by mouth 2 (two) times daily as needed (shingles flare).    Marland Kitchen co-enzyme Q-10 30 MG capsule Take 30 mg by mouth daily.    Marland Kitchen diltiazem (CARDIZEM CD) 180 MG 24 hr capsule Take 180 mg by mouth at bedtime.    . diphenhydrAMINE (BENADRYL) 25 MG tablet Take 25 mg by mouth daily as needed (migraines).      . famotidine (PEPCID) 20 MG tablet Take 20 mg by mouth daily as needed for heartburn or indigestion.    . fexofenadine (ALLEGRA) 180 MG tablet Take 180 mg by mouth daily as needed.     . metoprolol succinate (TOPROL-XL) 100 MG 24 hr  tablet Take 100 mg by mouth at bedtime. Take with or immediately following a meal.    . Multiple Vitamin (MULTIVITAMIN WITH MINERALS) TABS tablet Take 1 tablet by mouth daily.    . psyllium (REGULOID) 0.52 g capsule Take 0.52 g by mouth 2 (two) times daily.    . rivaroxaban (XARELTO) 20 MG TABS tablet Take 20 mg by mouth at bedtime.    . rosuvastatin (CRESTOR) 10 MG tablet Take 10 mg by mouth at bedtime.    . triamcinolone ointment (KENALOG) 0.1 % Apply 1 application topically daily as needed (rash).    . valsartan (DIOVAN) 160 MG tablet Take 160 mg by mouth daily.    Marland Kitchen EPINEPHrine (EPIPEN 2-PAK) 0.3 mg/0.3 mL IJ SOAJ injection Inject 0.3 mg into the muscle daily as needed (allergic reaction).    . nitroGLYCERIN (NITROSTAT) 0.4 MG SL tablet Place 0.4 mg under the tongue every 5 (five) minutes as needed for chest pain.     No current facility-administered medications for this visit.    Allergies  Allergen Reactions  . Eggs Or Egg-Derived Products Swelling and Other (See Comments)    egg whites only!  Causes GI discomfort and excess phlegm   . Carvedilol Diarrhea  . Cheese     Cheddar cheese; Upset stomach   . Hydralazine Other (See Comments)    Abdominal pain     Review of Systems: All systems reviewed and negative except where noted in HPI.   Creatinine clearance cannot be calculated (Patient's most recent lab result is older than the maximum 21 days allowed.)   Physical Exam:    Wt Readings from Last 3 Encounters:  12/06/17 218 lb (98.9 kg)  04/28/17 220 lb (99.8 kg)  04/19/17 220 lb (99.8 kg)    BP 136/74   Pulse 88   Resp 18   Ht '5\' 10"'  (1.778 m)   Wt 218 lb (98.9 kg)   SpO2 97%   BMI 31.28 kg/m  Constitutional:  Pleasant male in no  acute distress. Psychiatric: Normal mood and affect. Behavior is normal. EENT: Pupils normal.  Conjunctivae are normal. No scleral icterus. Neck supple.  Cardiovascular: Normal rate, irreg rhythm. No edema Pulmonary/chest: Effort normal and breath sounds normal. No wheezing, rales or rhonchi. Abdominal: Soft, nondistended, nontender. Bowel sounds active throughout. There are no masses palpable. No hepatomegaly. Neurological: Alert and oriented to person place and time. Skin: Skin is warm and dry. No rashes noted.  Tye Savoy, NP  12/06/2017, 9:52 AM  Cc: Coy Saunas, MD

## 2017-12-06 NOTE — Patient Instructions (Addendum)
If you are age 76 or older, your body mass index should be between 23-30. Your Body mass index is 31.28 kg/m. If this is out of the aforementioned range listed, please consider follow up with your Primary Care Provider.  If you are age 26 or younger, your body mass index should be between 19-25. Your Body mass index is 31.28 kg/m. If this is out of the aformentioned range listed, please consider follow up with your Primary Care Provider.   You have been scheduled for a colonoscopy. Please follow written instructions given to you at your visit today.  Please pick up your prep supplies at the pharmacy within the next 1-3 days. If you use inhalers (even only as needed), please bring them with you on the day of your procedure. Your physician has requested that you go to www.startemmi.com and enter the access code given to you at your visit today. This web site gives a general overview about your procedure. However, you should still follow specific instructions given to you by our office regarding your preparation for the procedure.  Your provider has requested that you go to the basement level for lab work before leaving today. Press "B" on the elevator. The lab is located at the first door on the left as you exit the elevator. CBC  We have sent the following medications to your pharmacy for you to pick up at your convenience:  You will be contacted by our office prior to your procedure for directions on holding your Xarelto.  If you do not hear from our office 1 week prior to your scheduled procedure, please call [phone] to discuss.   Thank you for choosing me and [facility].   Tye Savoy, NP

## 2017-12-13 ENCOUNTER — Telehealth: Payer: Self-pay

## 2017-12-13 NOTE — Progress Notes (Signed)
Reviewed and agree with documentation and assessment and plan. K. Veena Fouad Taul , MD   

## 2017-12-13 NOTE — Telephone Encounter (Signed)
Spoke with patient this morning regarding HOLDING Xarelto two days prior to colonoscopy.  Okayed per Dr. Omelia Blackwater.  Patient verbalized understanding.

## 2018-01-07 ENCOUNTER — Encounter: Payer: Medicare Other | Admitting: Gastroenterology

## 2018-02-28 ENCOUNTER — Encounter: Payer: Medicare Other | Admitting: Gastroenterology

## 2018-03-15 ENCOUNTER — Encounter: Payer: Self-pay | Admitting: Gastroenterology

## 2018-03-24 ENCOUNTER — Telehealth: Payer: Self-pay

## 2018-03-24 NOTE — Telephone Encounter (Signed)
Nathan Haney, this pt is coming in tomorrow for his PV prior to his colon on 04/05/18. The pt is on Eliquis and not Xarelto. The last instructions were given in 12/2017 when he saw [place].  Can you please get instructions from his cardiologist regarding when to hold the Eliquis. Thanks for your time. Gwyndolyn Saxon in [facility]

## 2018-03-25 ENCOUNTER — Ambulatory Visit (AMBULATORY_SURGERY_CENTER): Payer: Self-pay

## 2018-03-25 VITALS — Ht 70.0 in | Wt 216.0 lb

## 2018-03-25 DIAGNOSIS — K625 Hemorrhage of anus and rectum: Secondary | ICD-10-CM

## 2018-03-25 NOTE — Telephone Encounter (Signed)
Nathan Haney,  Have you heard from cardiologist yet? Nathan Haney has just left his PV visit and I will call with instructions.  Thanks, Meg PV (I'm in rm 52 today!)

## 2018-03-25 NOTE — Progress Notes (Signed)
No egg or soy allergy known to patient. Egg whites cause nausea No issues with past sedation with any surgeries  or procedures, no intubation problems  No diet pills per patient No home 02 use per patient   blood thinners per patient yes Eliquis Pt denies issues with constipation   A fib or A flutter yes on anti coag EMMI video sent to pt's e mail . declined

## 2018-03-30 NOTE — Telephone Encounter (Signed)
Received note from [place], [place] okaying patient to hold Eliquis 2 days prior to colonoscopy.  I spoke with patient this morning.  He verbalized understanding.  Note scanned to chart.

## 2018-04-05 ENCOUNTER — Encounter: Payer: Self-pay | Admitting: Gastroenterology

## 2018-04-05 ENCOUNTER — Ambulatory Visit (AMBULATORY_SURGERY_CENTER): Payer: Medicare Other | Admitting: Gastroenterology

## 2018-04-05 VITALS — BP 180/83 | HR 74 | Temp 97.3°F | Resp 15 | Ht 70.0 in | Wt 216.0 lb

## 2018-04-05 DIAGNOSIS — K648 Other hemorrhoids: Secondary | ICD-10-CM

## 2018-04-05 DIAGNOSIS — D124 Benign neoplasm of descending colon: Secondary | ICD-10-CM

## 2018-04-05 DIAGNOSIS — K625 Hemorrhage of anus and rectum: Secondary | ICD-10-CM

## 2018-04-05 MED ORDER — SODIUM CHLORIDE 0.9 % IV SOLN: 500.0000 mL | Freq: Once | INTRAVENOUS | Status: AC

## 2018-04-05 NOTE — Patient Instructions (Signed)
YOU HAD AN ENDOSCOPIC PROCEDURE TODAY AT [facility]:   Refer to the procedure report that was given to you for any specific questions about what was found during the examination.  If the procedure report does not answer your questions, please call your gastroenterologist to clarify.  If you requested that your care partner not be given the details of your procedure findings, then the procedure report has been included in a sealed envelope for you to review at your convenience later.  YOU SHOULD EXPECT: Some feelings of bloating in the abdomen. Passage of more gas than usual.  Walking can help get rid of the air that was put into your GI tract during the procedure and reduce the bloating. If you had a lower endoscopy (such as a colonoscopy or flexible sigmoidoscopy) you may notice spotting of blood in your stool or on the toilet paper. If you underwent a bowel prep for your procedure, you may not have a normal bowel movement for a few days.  Please Note:  You might notice some irritation and congestion in your nose or some drainage.  This is from the oxygen used during your procedure.  There is no need for concern and it should clear up in a day or so.  SYMPTOMS TO REPORT IMMEDIATELY:   Following lower endoscopy (colonoscopy or flexible sigmoidoscopy):  Excessive amounts of blood in the stool  Significant tenderness or worsening of abdominal pains  Swelling of the abdomen that is new, acute  Fever of 100F or higher   For urgent or emergent issues, a gastroenterologist can be reached at any hour by calling [phone].   DIET:  We do recommend a small meal at first, but then you may proceed to your regular diet.  Drink plenty of fluids but you should avoid alcoholic beverages for 24 hours.  MEDICATIONS: Continue present medications. Resume Eliquis tomorrow.  Please see handouts given to you by your recovery nurse.  ACTIVITY:  You should plan to take it easy for the rest  of today and you should NOT DRIVE or use heavy machinery until tomorrow (because of the sedation medicines used during the test).    FOLLOW UP: Our staff will call the number listed on your records the next business day following your procedure to check on you and address any questions or concerns that you may have regarding the information given to you following your procedure. If we do not reach you, we will leave a message.  However, if you are feeling well and you are not experiencing any problems, there is no need to return our call.  We will assume that you have returned to your regular daily activities without incident.  If any biopsies were taken you will be contacted by phone or by letter within the next 1-3 weeks.  Please call us at [phone] if you have not heard about the biopsies in 3 weeks.    SIGNATURES/CONFIDENTIALITY: You and/or your care partner have signed paperwork which will be entered into your electronic medical record.  These signatures attest to the fact that that the information above on your After Visit Summary has been reviewed and is understood.  Full responsibility of the confidentiality of this discharge information lies with you and/or your care-partner.

## 2018-04-05 NOTE — Progress Notes (Signed)
Called to room to assist during endoscopic procedure.  Patient ID and intended procedure confirmed with present staff. Received instructions for my participation in the procedure from the performing physician.  

## 2018-04-05 NOTE — Progress Notes (Signed)
To PACU, VSS. Report to [email or web address] 

## 2018-04-06 ENCOUNTER — Telehealth: Payer: Self-pay

## 2018-04-06 NOTE — Op Note (Signed)
[facility] Patient Name: Nathan Haney Procedure Date: 04/05/2018 11:05 AM MRN: [id number] Endoscopist: Remo Lipps P. Havery Moros , MD Age: 78 Referring MD:  Date of Birth: November 17, 1940 Gender: Male Account #: [id number] Procedure:                Colonoscopy Indications:              Rectal bleeding, on Eliquis Medicines:                Monitored Anesthesia Care Procedure:                Pre-Anesthesia Assessment:                           - Prior to the procedure, a History and Physical                            was performed, and patient medications and                            allergies were reviewed. The patient's tolerance of                            previous anesthesia was also reviewed. The risks                            and benefits of the procedure and the sedation                            options and risks were discussed with the patient.                            All questions were answered, and informed consent                            was obtained. Prior Anticoagulants: The patient has                            taken Eliquis (apixaban), last dose was 2 days                            prior to procedure. ASA Grade Assessment: III - A                            patient with severe systemic disease. After                            reviewing the risks and benefits, the patient was                            deemed in satisfactory condition to undergo the                            procedure.  After obtaining informed consent, the colonoscope                            was passed under direct vision. Throughout the                            procedure, the patient's blood pressure, pulse, and                            oxygen saturations were monitored continuously. The                            Colonoscope was introduced through the anus and                            advanced to the the terminal ileum, with   identification of the appendiceal orifice and IC                            valve. The colonoscopy was performed without                            difficulty. The patient tolerated the procedure                            well. The quality of the bowel preparation was                            good. The terminal ileum, ileocecal valve,                            appendiceal orifice, and rectum were photographed. Scope In: 11:10:47 AM Scope Out: 11:28:26 AM Scope Withdrawal Time: 0 hours 13 minutes 39 seconds  Total Procedure Duration: 0 hours 17 minutes 39 seconds  Findings:                 The perianal and digital rectal examinations were                            normal.                           The terminal ileum appeared normal.                           A 6 to 7 mm polyp was found in the descending                            colon. The polyp was sessile. The polyp was removed                            with a cold snare. Resection and retrieval were                            complete. Upon withdrawal  there was some mild                            persistent oozing at the polypectomy site. It was                            treated with snare tip coagulation with good                            hemostasis.                           Internal hemorrhoids were found during                            retroflexion. The hemorrhoids were small.                           The exam was otherwise without abnormality. Complications:            No immediate complications. Estimated blood loss:                            Minimal. Estimated Blood Loss:     Estimated blood loss was minimal. Impression:               - The examined portion of the ileum was normal.                           - One 6 to 7 mm polyp in the descending colon,                            removed with a cold snare. Resected and retrieved.                           - Internal hemorrhoids.                           - The  examination was otherwise normal.                           Overall suspect bleeding symptoms are due to                            hemorrhoids in the setting of anticoagulation. Recommendation:           - Patient has a contact number available for                            emergencies. The signs and symptoms of potential                            delayed complications were discussed with the                            patient. Return to normal activities tomorrow.  Written discharge instructions were provided to the                            patient.                           - Resume previous diet.                           - Continue present medications.                           - Await pathology results.                           - Resume Eliquis tomorrow Carlota Raspberry. Lelend Heinecke, MD 04/05/2018 11:34:32 AM This report has been signed electronically.

## 2018-04-06 NOTE — Telephone Encounter (Signed)
  Follow up Call-  Call back number 04/05/2018  Post procedure Call Back phone  # [phone]  Permission to leave phone message Yes  Some recent data might be hidden     Patient questions:  Do you have a fever, pain , or abdominal swelling? No. Pain Score  0 *  Have you tolerated food without any problems? Yes.    Have you been able to return to your normal activities? Yes.    Do you have any questions about your discharge instructions: Diet   No. Medications  No. Follow up visit  No.  Do you have questions or concerns about your Care? No.  Actions: * If pain score is 4 or above: No action needed, pain <4.

## 2018-04-06 NOTE — Telephone Encounter (Signed)
No answer, left message to call back later today, B.Kelijah Towry RN. 

## 2018-10-02 IMAGING — DX DG KNEE 1-2V PORT*L*
2 series · 2 of 2 positions shown · non-contrast
Comparison: None.

CLINICAL DATA: Status post left total knee arthroplasty.

EXAM:
PORTABLE LEFT KNEE - 1-2 VIEW

[knee ap]
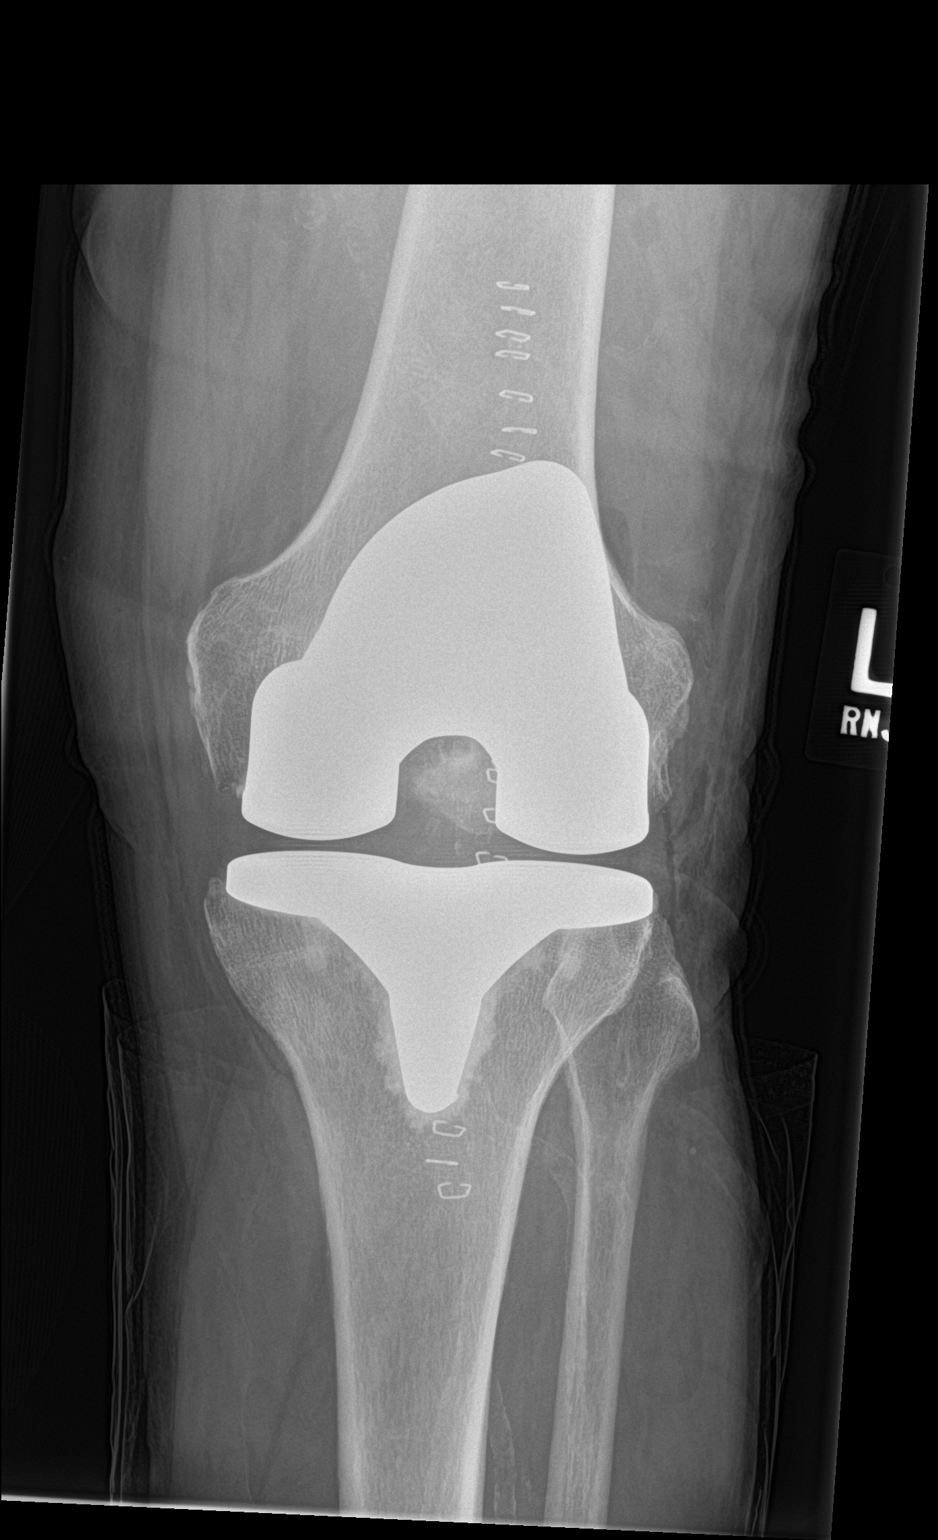

[knee lat]
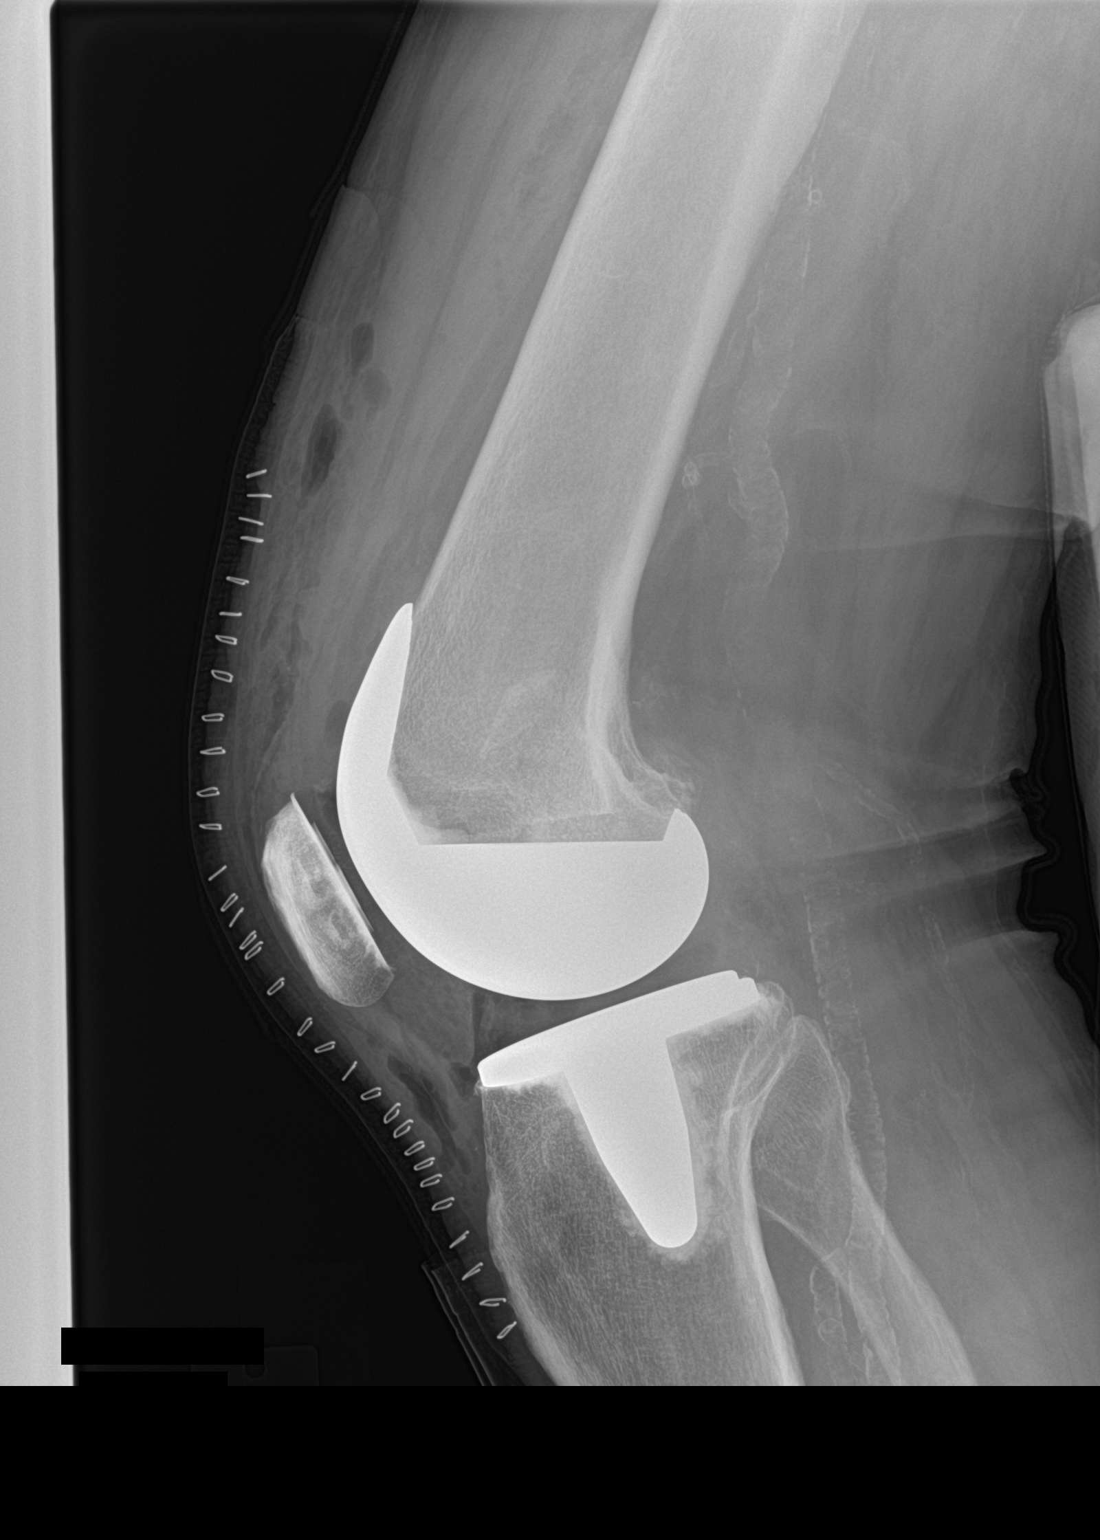

[2 of 2 positions shown; findings below may reference images not displayed]

FINDINGS: The femoral and tibial components appear to be well situated. No
fracture or dislocation is noted. Expected postoperative changes are
noted in the soft tissues anteriorly. Vascular calcifications are
noted.
IMPRESSION: Status post left total knee arthroplasty.

## 2019-02-25 ENCOUNTER — Ambulatory Visit: Payer: Medicare Other
